# Patient Record
Sex: Female | Born: 1960 | Race: Black or African American | Hispanic: No | Marital: Single | Smoking: Current every day smoker
Health system: Southern US, Community
[De-identification: ages and names within clinical notes are randomized; demographics above are authoritative.]

## PROBLEM LIST (undated history)

## (undated) DIAGNOSIS — E7800 Pure hypercholesterolemia, unspecified: Secondary | ICD-10-CM

## (undated) DIAGNOSIS — I10 Essential (primary) hypertension: Secondary | ICD-10-CM

## (undated) HISTORY — DX: Pure hypercholesterolemia, unspecified: E78.00

---

## 2001-09-16 ENCOUNTER — Emergency Department (HOSPITAL_COMMUNITY): Admission: EM | Admit: 2001-09-16 | Discharge: 2001-09-16 | Payer: Self-pay | Admitting: Emergency Medicine

## 2001-09-16 ENCOUNTER — Encounter: Payer: Self-pay | Admitting: Emergency Medicine

## 2002-08-08 ENCOUNTER — Emergency Department (HOSPITAL_COMMUNITY): Admission: EM | Admit: 2002-08-08 | Discharge: 2002-08-08 | Payer: Self-pay | Admitting: Emergency Medicine

## 2003-08-04 ENCOUNTER — Emergency Department (HOSPITAL_COMMUNITY): Admission: EM | Admit: 2003-08-04 | Discharge: 2003-08-04 | Payer: Self-pay | Admitting: Emergency Medicine

## 2005-08-01 ENCOUNTER — Emergency Department (HOSPITAL_COMMUNITY): Admission: EM | Admit: 2005-08-01 | Discharge: 2005-08-01 | Payer: Self-pay | Admitting: *Deleted

## 2005-08-04 ENCOUNTER — Ambulatory Visit: Payer: Self-pay | Admitting: Family Medicine

## 2005-08-06 ENCOUNTER — Ambulatory Visit: Payer: Self-pay | Admitting: *Deleted

## 2009-09-01 ENCOUNTER — Inpatient Hospital Stay (HOSPITAL_COMMUNITY): Admission: EM | Admit: 2009-09-01 | Discharge: 2009-09-03 | Payer: Self-pay | Admitting: Emergency Medicine

## 2009-09-23 ENCOUNTER — Ambulatory Visit: Payer: Self-pay | Admitting: Family Medicine

## 2009-09-24 ENCOUNTER — Encounter (INDEPENDENT_AMBULATORY_CARE_PROVIDER_SITE_OTHER): Payer: Self-pay | Admitting: Internal Medicine

## 2009-09-24 LAB — CONVERTED CEMR LAB
Albumin: 4.3 g/dL (ref 3.5–5.2)
CO2: 18 meq/L — ABNORMAL LOW (ref 19–32)
Calcium: 8.4 mg/dL (ref 8.4–10.5)
Ferritin: 62 ng/mL (ref 10–291)
Glucose, Bld: 88 mg/dL (ref 70–99)
Iron: 53 ug/dL (ref 42–145)
Potassium: 3.7 meq/L (ref 3.5–5.3)
Sodium: 142 meq/L (ref 135–145)
TIBC: 390 ug/dL (ref 250–470)
TSH: 0.771 microintl units/mL (ref 0.350–4.500)
Total Protein: 7.4 g/dL (ref 6.0–8.3)
UIBC: 337 ug/dL

## 2009-10-02 ENCOUNTER — Ambulatory Visit: Payer: Self-pay | Admitting: Internal Medicine

## 2010-01-14 ENCOUNTER — Ambulatory Visit: Payer: Self-pay | Admitting: Internal Medicine

## 2010-01-14 LAB — CONVERTED CEMR LAB
ALT: <8 units/L (ref 0–35)
AST: 21 units/L (ref 0–37)
Albumin: 4.3 g/dL (ref 3.5–5.2)
Alkaline Phosphatase: 81 units/L (ref 39–117)
BUN: 13 mg/dL (ref 6–23)
CO2: 23 meq/L (ref 19–32)
Calcium: 9.4 mg/dL (ref 8.4–10.5)
Chloride: 106 meq/L (ref 96–112)
Creatinine, Ser: 0.63 mg/dL (ref 0.40–1.20)
Folate: 5.3 ng/mL
Glucose, Bld: 85 mg/dL (ref 70–99)
Potassium: 4 meq/L (ref 3.5–5.3)
Sodium: 141 meq/L (ref 135–145)
TSH: 0.806 microintl units/mL (ref 0.350–4.500)
Total Bilirubin: 0.3 mg/dL (ref 0.3–1.2)
Total Protein: 7.7 g/dL (ref 6.0–8.3)
Vitamin B-12: 390 pg/mL (ref 211–911)

## 2010-01-22 ENCOUNTER — Ambulatory Visit (HOSPITAL_COMMUNITY): Admission: RE | Admit: 2010-01-22 | Discharge: 2010-01-22 | Payer: Self-pay | Admitting: Internal Medicine

## 2010-09-13 LAB — CBC
HCT: 29.7 % — ABNORMAL LOW (ref 36.0–46.0)
MCV: 100.3 fL — ABNORMAL HIGH (ref 78.0–100.0)
Platelets: 80 10*3/uL — ABNORMAL LOW (ref 150–400)
Platelets: 83 10*3/uL — ABNORMAL LOW (ref 150–400)
RBC: 3 MIL/uL — ABNORMAL LOW (ref 3.87–5.11)
RDW: 15 % (ref 11.5–15.5)
WBC: 6 10*3/uL (ref 4.0–10.5)

## 2010-09-13 LAB — POCT I-STAT, CHEM 8
Calcium, Ion: 0.97 mmol/L — ABNORMAL LOW (ref 1.12–1.32)
Chloride: 100 mEq/L (ref 96–112)
HCT: 32 % — ABNORMAL LOW (ref 36.0–46.0)
TCO2: 23 mmol/L (ref 0–100)

## 2010-09-13 LAB — CULTURE, RESPIRATORY W GRAM STAIN: Culture: NORMAL

## 2010-09-13 LAB — COMPREHENSIVE METABOLIC PANEL
ALT: 35 U/L (ref 0–35)
AST: 161 U/L — ABNORMAL HIGH (ref 0–37)
Albumin: 2.8 g/dL — ABNORMAL LOW (ref 3.5–5.2)
Chloride: 102 mEq/L (ref 96–112)
Creatinine, Ser: 0.62 mg/dL (ref 0.4–1.2)
GFR calc Af Amer: >60 mL/min (ref >60)
Sodium: 137 mEq/L (ref 135–145)
Total Bilirubin: 0.9 mg/dL (ref 0.3–1.2)

## 2010-09-13 LAB — POCT CARDIAC MARKERS
CKMB, poc: <1 ng/mL — ABNORMAL LOW (ref 1.0–8.0)
Myoglobin, poc: 82.1 ng/mL (ref 12–200)
Troponin i, poc: <0.05 ng/mL (ref 0.00–0.09)

## 2010-09-13 LAB — D-DIMER, QUANTITATIVE: D-Dimer, Quant: 2.44 ug/mL-FEU — ABNORMAL HIGH (ref 0.00–0.48)

## 2010-09-13 LAB — DIFFERENTIAL
Basophils Absolute: 0 10*3/uL (ref 0.0–0.1)
Eosinophils Relative: 0 % (ref 0–5)
Lymphocytes Relative: 14 % (ref 12–46)

## 2010-09-13 LAB — ETHANOL: Alcohol, Ethyl (B): 210 mg/dL — ABNORMAL HIGH (ref 0–10)

## 2010-09-13 LAB — EXPECTORATED SPUTUM ASSESSMENT W GRAM STAIN, RFLX TO RESP C

## 2010-12-24 ENCOUNTER — Emergency Department (HOSPITAL_COMMUNITY)
Admission: EM | Admit: 2010-12-24 | Discharge: 2010-12-24 | Disposition: A | Discharge status: Home / Self Care | Payer: Self-pay | Attending: Emergency Medicine | Admitting: Emergency Medicine

## 2010-12-24 ENCOUNTER — Emergency Department (HOSPITAL_COMMUNITY): Payer: Self-pay

## 2010-12-24 DIAGNOSIS — W19XXXA Unspecified fall, initial encounter: Secondary | ICD-10-CM | POA: Insufficient documentation

## 2010-12-24 DIAGNOSIS — S61509A Unspecified open wound of unspecified wrist, initial encounter: Secondary | ICD-10-CM | POA: Insufficient documentation

## 2014-06-27 ENCOUNTER — Emergency Department (HOSPITAL_COMMUNITY): Payer: Self-pay

## 2014-06-27 ENCOUNTER — Encounter (HOSPITAL_COMMUNITY): Payer: Self-pay | Admitting: Cardiology

## 2014-06-27 ENCOUNTER — Emergency Department (HOSPITAL_COMMUNITY)
Admission: EM | Admit: 2014-06-27 | Discharge: 2014-06-27 | Disposition: A | Discharge status: Home / Self Care | Payer: Self-pay | Attending: Emergency Medicine | Admitting: Emergency Medicine

## 2014-06-27 DIAGNOSIS — R202 Paresthesia of skin: Secondary | ICD-10-CM | POA: Insufficient documentation

## 2014-06-27 DIAGNOSIS — R531 Weakness: Secondary | ICD-10-CM | POA: Insufficient documentation

## 2014-06-27 DIAGNOSIS — Z720 Tobacco use: Secondary | ICD-10-CM | POA: Insufficient documentation

## 2014-06-27 DIAGNOSIS — R200 Anesthesia of skin: Secondary | ICD-10-CM | POA: Insufficient documentation

## 2014-06-27 HISTORY — DX: Essential (primary) hypertension: I10

## 2014-06-27 LAB — CBC
HEMATOCRIT: 37.9 % (ref 36.0–46.0)
HEMOGLOBIN: 13.2 g/dL (ref 12.0–15.0)
MCH: 32.2 pg (ref 26.0–34.0)
MCHC: 34.8 g/dL (ref 30.0–36.0)
MCV: 92.4 fL (ref 78.0–100.0)
Platelets: 175 10*3/uL (ref 150–400)
RBC: 4.1 MIL/uL (ref 3.87–5.11)
RDW: 13.4 % (ref 11.5–15.5)
WBC: 5.6 10*3/uL (ref 4.0–10.5)

## 2014-06-27 LAB — BASIC METABOLIC PANEL
ANION GAP: 7 (ref 5–15)
BUN: 9 mg/dL (ref 6–23)
CHLORIDE: 107 meq/L (ref 96–112)
CO2: 26 mmol/L (ref 19–32)
CREATININE: 0.79 mg/dL (ref 0.50–1.10)
Calcium: 9.1 mg/dL (ref 8.4–10.5)
GFR calc Af Amer: >90 mL/min (ref >90)
GFR calc non Af Amer: >90 mL/min (ref >90)
Glucose, Bld: 101 mg/dL — ABNORMAL HIGH (ref 70–99)
POTASSIUM: 4.1 mmol/L (ref 3.5–5.1)
Sodium: 140 mmol/L (ref 135–145)

## 2014-06-27 LAB — I-STAT TROPONIN, ED: TROPONIN I, POC: 0 ng/mL (ref 0.00–0.08)

## 2014-06-27 MED ORDER — HYDROCHLOROTHIAZIDE 25 MG PO TABS: 25.0000 mg | ORAL_TABLET | Freq: Every day | ORAL | Status: DC

## 2014-06-27 NOTE — ED Notes (Signed)
Patient transported to MRI 

## 2014-06-27 NOTE — ED Provider Notes (Signed)
CSN: [id number]     Arrival date & time 06/27/14  4098 History   First MD Initiated Contact with Patient 06/27/14 1036     Chief Complaint  Patient presents with  . Numbness     (Consider location/radiation/quality/duration/timing/severity/associated sxs/prior Treatment) HPI Complains of intermittent left arm numbness and weakness for the past 2 weeks and intermittent right leg numbness and weakness for the past  1week. Numbness and weakness do not occur at the same time. Nothing makes symptoms better or worse. No treatment prior to coming here. No other associated symptoms no difficulty speaking or swallowing. No headache no chest pain. Presently asymptomatic without treatment History reviewed. No pertinent past medical history. past medical history hypertension History reviewed. No pertinent past surgical history. History reviewed. No pertinent family history. History  Substance Use Topics  . Smoking status: Current Every Day Smoker  . Smokeless tobacco: Not on file  . Alcohol Use: Yes   no illicit drug use OB History    No data available     Review of Systems  HENT: Negative.   Respiratory: Negative.   Cardiovascular: Negative.   Gastrointestinal: Negative.   Musculoskeletal: Negative.   Skin: Negative.   Neurological: Positive for weakness and numbness.  Psychiatric/Behavioral: Negative.   All other systems reviewed and are negative.     Allergies  Review of patient's allergies indicates no known allergies.  Home Medications   Prior to Admission medications   Not on File   BP 137/93 mmHg  Pulse 79  Temp(Src) 97.9 F (36.6 C) (Oral)  Wt 140 lb (63.504 kg)  SpO2 100% Physical Exam  Constitutional: She is oriented to person, place, and time. She appears well-developed and well-nourished.  HENT:  Head: Normocephalic and atraumatic.  Eyes: Conjunctivae are normal. Pupils are equal, round, and reactive to light.  Neck: Neck supple. No tracheal deviation present.  No thyromegaly present.  Cardiovascular: Normal rate and regular rhythm.   No murmur heard. Pulmonary/Chest: Effort normal and breath sounds normal.  Abdominal: Soft. Bowel sounds are normal. She exhibits no distension. There is no tenderness.  Musculoskeletal: Normal range of motion. She exhibits no edema or tenderness.  Neurological: She is alert and oriented to person, place, and time. She has normal reflexes. She displays normal reflexes. No cranial nerve deficit. She exhibits normal muscle tone. Coordination normal.  Gait normal Romberg normal pronator drift normal motor strength 5 over 5 overall grip strengths bilateral equal and 5 over 5 finger to nose normal DTR symmetric bilaterally knee jerk ankle jerk and biceps toes or going bilaterally  Skin: Skin is warm and dry. No rash noted.  Psychiatric: She has a normal mood and affect. Judgment normal.  Nursing note and vitals reviewed.   ED Course  Procedures (including critical care time) Labs Review Labs Reviewed  CBC  BASIC METABOLIC PANEL  I-STAT TROPOININ, ED    Imaging Review Ct Head (brain) Wo Contrast  06/27/2014   CLINICAL DATA:  Left arm weakness for several weeks, worsening last night  EXAM: CT HEAD WITHOUT CONTRAST  TECHNIQUE: Contiguous axial images were obtained from the base of the skull through the vertex without intravenous contrast.  COMPARISON:  None.  FINDINGS: No skull fracture is noted. Paranasal sinuses and mastoid air cells are unremarkable. No intracranial hemorrhage, mass effect or midline shift. No acute cortical infarction. No intra or extra-axial fluid collection. No mass lesion is noted on this unenhanced scan. The gray and white-matter differentiation is preserved.  IMPRESSION: No acute  intracranial abnormality.   Electronically Signed   By: Natasha Mead M.D.   On: 06/27/2014 10:32     EKG Interpretation   Date/Time:  Thursday June 27 2014 09:57:32 EST Ventricular Rate:  76 PR Interval:  160 QRS  Duration: 74 QT Interval:  382 QTC Calculation: 429 R Axis:   81 Text Interpretation:  Sinus rhythm with marked sinus arrhythmia Otherwise  normal ECG No significant change since last tracing Confirmed by  Ethelda Chick  MD, Baylon Santelli [phone]) on 06/27/2014 2:48:09 PM     2:45 PM patient asymptomatic  Results for orders placed or performed during the hospital encounter of 06/27/14  CBC  Result Value Ref Range   WBC 5.6 4.0 - 10.5 K/uL   RBC 4.10 3.87 - 5.11 MIL/uL   Hemoglobin 13.2 12.0 - 15.0 g/dL   HCT 74.2 59.5 - 63.8 %   MCV 92.4 78.0 - 100.0 fL   MCH 32.2 26.0 - 34.0 pg   MCHC 34.8 30.0 - 36.0 g/dL   RDW 75.6 43.3 - 29.5 %   Platelets 175 150 - 400 K/uL  Basic metabolic panel  Result Value Ref Range   Sodium 140 135 - 145 mmol/L   Potassium 4.1 3.5 - 5.1 mmol/L   Chloride 107 96 - 112 mEq/L   CO2 26 19 - 32 mmol/L   Glucose, Bld 101 (H) 70 - 99 mg/dL   BUN 9 6 - 23 mg/dL   Creatinine, Ser 1.88 0.50 - 1.10 mg/dL   Calcium 9.1 8.4 - 41.6 mg/dL   GFR calc non Af Amer >90 >90 mL/min   GFR calc Af Amer >90 >90 mL/min   Anion gap 7 5 - 15  I-stat troponin, ED (not at [facility])  Result Value Ref Range   Troponin i, poc 0.00 0.00 - 0.08 ng/mL   Comment 3           Ct Head (brain) Wo Contrast  06/27/2014   CLINICAL DATA:  Left arm weakness for several weeks, worsening last night  EXAM: CT HEAD WITHOUT CONTRAST  TECHNIQUE: Contiguous axial images were obtained from the base of the skull through the vertex without intravenous contrast.  COMPARISON:  None.  FINDINGS: No skull fracture is noted. Paranasal sinuses and mastoid air cells are unremarkable. No intracranial hemorrhage, mass effect or midline shift. No acute cortical infarction. No intra or extra-axial fluid collection. No mass lesion is noted on this unenhanced scan. The gray and white-matter differentiation is preserved.  IMPRESSION: No acute intracranial abnormality.   Electronically Signed   By: Natasha Mead M.D.   On: 06/27/2014 10:32    Mr Brain Wo Contrast  06/27/2014   CLINICAL DATA:  Left-sided weakness 2 weeks  EXAM: MRI HEAD WITHOUT CONTRAST  TECHNIQUE: Multiplanar, multiecho pulse sequences of the brain and surrounding structures were obtained without intravenous contrast.  COMPARISON:  CT head 06/27/2014  FINDINGS: Negative for acute infarct.  Scattered small hyperintensities in the subcortical white matter bilaterally most likely related to chronic microvascular ischemia. Brainstem and cerebellum intact. No cortical infarct.  Ventricle size is normal.  Pituitary normal in size  Negative for hemorrhage or fluid collection  Negative for mass or edema  Mucosal edema in the left maxillary sinus is mild.  IMPRESSION: No acute abnormality.  Mild chronic microvascular ischemia.   Electronically Signed   By: Marlan Palau M.D.   On: 06/27/2014 13:45    Counseled patient for 5 minutes on smoking cessation MDM  She's been  furnished with several locations for primary care physician [facility] and [place] [place] [facility] Final diagnoses:  None   Rx HCTZ Diagnosis #1 paresthesias #2tobacco abuse  #3hypertension     Doug SouSam Brittain Hosie, MD 06/27/14 1453

## 2014-06-27 NOTE — Discharge Instructions (Signed)
Call any of the numbers that you were furnished tomorrow in order to get a primary care physician. Ask your new primary care physician to help you stop smoking. Call [facility] or [place] [facility] to schedule the next available appointment regarding your numbness. Take 1 baby aspirin (81 milligrams) daily. Take the medication as prescribed for blood pressure. Get your blood pressure rechecked within the next 3 weeks

## 2014-06-27 NOTE — ED Notes (Signed)
Pt reports that she has had left sided weakness for the past 2 weeks. States that it seems to have gotten worse over the past couple of days. Grip strength slight decreased on the left side. Pt A&Ox4

## 2014-11-08 ENCOUNTER — Ambulatory Visit: Payer: Self-pay | Admitting: Neurology

## 2014-12-18 ENCOUNTER — Ambulatory Visit (INDEPENDENT_AMBULATORY_CARE_PROVIDER_SITE_OTHER): Payer: No Typology Code available for payment source | Admitting: Neurology

## 2014-12-18 ENCOUNTER — Encounter: Payer: Self-pay | Admitting: Neurology

## 2014-12-18 VITALS — BP 110/78 | HR 80 | Ht 71.0 in | Wt 154.2 lb

## 2014-12-18 DIAGNOSIS — M6289 Other specified disorders of muscle: Secondary | ICD-10-CM | POA: Diagnosis not present

## 2014-12-18 DIAGNOSIS — R011 Cardiac murmur, unspecified: Secondary | ICD-10-CM

## 2014-12-18 DIAGNOSIS — R202 Paresthesia of skin: Secondary | ICD-10-CM

## 2014-12-18 DIAGNOSIS — R29898 Other symptoms and signs involving the musculoskeletal system: Secondary | ICD-10-CM

## 2014-12-18 NOTE — Progress Notes (Signed)
Note sent

## 2014-12-18 NOTE — Progress Notes (Signed)
[facility] Clinic Note - Initial Visit   Date: 12/18/2014   Jaime Adams MRN: [id number] DOB: 1960-09-01   Dear Jaime Passe, NP:  Thank you for your kind referral of Jaime Adams for consultation of bilateral hand weakness. Although her history is well known to you, please allow [place] to reiterate it for the purpose of our medical record. The patient was accompanied to the clinic by self.    History of Present Illness: Jaime Adams is a 54 y.o. right-handed African American female with hyperlipidemia presenting for evaluation of bilateral hand weakness.    She works as a Sales executive at [employer]. Starting around February 2016, she starting experiencing difficulty with grasping the spoon to plate the food.  It was only lasting a few seconds to minutes initially.  It started to involve both hands, where she was unable to hold the plate.  During these spells, she cannot move her hands and they are numb.  She is able to move her forearm with her elbow, but has no movement of her wrist or fingers.  Last week, it lasted about 5 minutes before she regained strength of her hands.  She has numbness/tingling especially at night.  No neck pain or cramping of the hands.  These spells predominately occur at work and she has noticed that anxiety can trigger it at times.  She tries to breathe and relax through it.  It occurs about 2-3 times per month.  Sometimes, she can tell when it comes on because she gets anxious and excited.  She does not loose consciousness.     She reports having a slight stroke manifesting with right facial droop when she was in elementary school.    Out-side paper records, electronic medical record, and images have been reviewed where available and summarized as:  Labs 08/26/2014:  TSH 1.178, BMP nml  Past Medical History  Diagnosis Date  . Hypertension   . High cholesterol     History reviewed. No pertinent  past surgical history.   Medications:  No current outpatient prescriptions on file prior to visit.   No current facility-administered medications on file prior to visit.    Allergies: No Known Allergies  Family History: Family History  Problem Relation Age of Onset  . Hypertension Mother   . Cancer Maternal Aunt   . Alzheimer's disease Mother   . Heart attack Brother   . Cirrhosis Father   . Stroke Brother   . Aneurysm Brother   . Diabetes Brother     Social History: History   Social History  . Marital Status: Married    Spouse Name: N/A  . Number of Children: N/A  . Years of Education: N/A   Occupational History  . Not on file.   Social History Main Topics  . Smoking status: Current Every Day Smoker  . Smokeless tobacco: Never Used  . Alcohol Use: 0.0 oz/week    0 Standard drinks or equivalent per week  . Drug Use: No  . Sexual Activity: Not on file   Other Topics Concern  . Not on file   Social History Narrative   Lives alone.  Rents a room.  Has 2 children.  Works at [employer].  Education: high school.    Review of Systems:  CONSTITUTIONAL: No fevers, chills, night sweats, or weight loss.   EYES: No visual changes or eye pain ENT: No hearing changes.  No history of nose bleeds.  RESPIRATORY: No cough, wheezing and shortness of breath.   CARDIOVASCULAR: Negative for chest pain, and palpitations.   GI: Negative for abdominal discomfort, blood in stools or black stools.  No recent change in bowel habits.   GU:  No history of incontinence.   MUSCLOSKELETAL: No history of joint pain or swelling.  No myalgias.   SKIN: Negative for lesions, rash, and itching.   HEMATOLOGY/ONCOLOGY: Negative for prolonged bleeding, bruising easily, and swollen nodes.  No history of cancer.   ENDOCRINE: Negative for cold or heat intolerance, polydipsia or goiter.   PSYCH:  No depression + anxiety symptoms.   NEURO: As Above.   Vital Signs:  BP 110/78 mmHg  Pulse 80   Ht 5\' 11"  (1.803 m)  Wt 154 lb 3 oz (69.939 kg)  BMI 21.51 kg/m2  SpO2 92% Pain Scale: 0 on a scale of 0-10   General Medical Exam:   General:  Well appearing, comfortable.   Eyes/ENT: see cranial nerve examination.   Neck: No masses appreciated.  Full range of motion without tenderness.  No carotid bruits. Respiratory:  Clear to auscultation, good air entry bilaterally.   Cardiac:  Systolic murmur, regular rate Extremities:  No deformities, edema, or skin discoloration.  Skin:  No rashes or lesions.  Neurological Exam: MENTAL STATUS including orientation to time, place, person, recent and remote memory, attention span and concentration, language, and fund of knowledge is normal.  Speech is not dysarthric.  CRANIAL NERVES: II:  No visual field defects.  Unremarkable fundi.   III-IV-VI: Pupils equal round and reactive to light.  Normal conjugate, extra-ocular eye movements in all directions of gaze.  No nystagmus.  No ptosis.   V:  Normal facial sensation.     VII:  Normal facial symmetry and movements.  No pathologic facial reflexes.  VIII:  Normal hearing and vestibular function.   IX-X:  Normal palatal movement.   XI:  Normal shoulder shrug and head rotation.   XII:  Normal tongue strength and range of motion, no deviation or fasciculation.  MOTOR:  No atrophy, fasciculations or abnormal movements.  No pronator drift.  Tone is normal.    Right Upper Extremity:    Left Upper Extremity:    Deltoid  5/5   Deltoid  5/5   Biceps  5/5   Biceps  5/5   Triceps  5/5   Triceps  5/5   Wrist extensors  5/5   Wrist extensors  5/5   Wrist flexors  5/5   Wrist flexors  5/5   Finger extensors  5/5   Finger extensors  5/5   Finger flexors  5/5   Finger flexors  5/5   Dorsal interossei  5/5   Dorsal interossei  5/5   Abductor pollicis  5/5   Abductor pollicis  5/5   Tone (Ashworth scale)  0  Tone (Ashworth scale)  0   Right Lower Extremity:    Left Lower Extremity:    Hip flexors  5/5    Hip flexors  5/5   Hip extensors  5/5   Hip extensors  5/5   Knee flexors  5/5   Knee flexors  5/5   Knee extensors  5/5   Knee extensors  5/5   Dorsiflexors  5/5   Dorsiflexors  5/5   Plantarflexors  5/5   Plantarflexors  5/5   Toe extensors  5/5   Toe extensors  5/5   Toe flexors  5/5   Toe flexors  5/5   Tone (Ashworth scale)  0  Tone (Ashworth scale)  0   MSRs:  Right                                                                 Left brachioradialis 2+  brachioradialis 2+  biceps 2+  biceps 2+  triceps 2+  triceps 2+  patellar 2+  patellar 2+  ankle jerk 1+  ankle jerk 1+  Hoffman no  Hoffman no  plantar response down  plantar response down   SENSORY:  Normal and symmetric perception of light touch, pinprick, vibration, and proprioception.  Romberg's sign absent.   COORDINATION/GAIT: Normal finger-to- nose-finger and heel-to-shin.  Intact rapid alternating movements bilaterally.  Able to rise from a chair without using arms.  Gait narrow based and stable. Tandem and stressed gait intact.    IMPRESSION/PLAN: Ms. Urbieta is a 54 year-old female presenting for evaluation of episodic bilateral hand weakness and numbness.  Her exam is entirely normal and non-focal making it difficult to localize her symptoms.  EMG of the upper extremities will be ordered to better characterize the nature of her symptoms - symptoms are atypical for both carpal tunnel syndrome and cervical radiculopathy.    Regarding her systolic heart murmur, I have asked her to follow-up with her PCP   Return to clinic in 6-8 weeks   The duration of this appointment visit was 40 minutes of face-to-face time with the patient.  Greater than 50% of this time was spent in counseling, explanation of diagnosis, planning of further management, and coordination of care.   Thank you for allowing me to participate in patient's care.  If I can answer any additional questions, I would be pleased to do so.     Sincerely,    Kilo Eshelman K. Allena Katz, DO

## 2014-12-18 NOTE — Patient Instructions (Addendum)
1.  EMG of the upper extremities 2.  Follow-up with your primary care doctor regarding any additional work for your heart murmur 3.  Return to clinic in 6 weeks

## 2015-01-07 ENCOUNTER — Encounter: Payer: No Typology Code available for payment source | Admitting: Neurology

## 2015-01-17 ENCOUNTER — Other Ambulatory Visit: Payer: Self-pay | Admitting: *Deleted

## 2015-01-17 DIAGNOSIS — R202 Paresthesia of skin: Secondary | ICD-10-CM

## 2015-01-17 DIAGNOSIS — R29898 Other symptoms and signs involving the musculoskeletal system: Secondary | ICD-10-CM

## 2015-01-21 ENCOUNTER — Encounter: Payer: No Typology Code available for payment source | Admitting: Neurology

## 2015-02-13 ENCOUNTER — Ambulatory Visit: Payer: No Typology Code available for payment source | Admitting: Neurology

## 2015-03-07 ENCOUNTER — Ambulatory Visit: Payer: No Typology Code available for payment source | Admitting: Neurology

## 2015-06-09 ENCOUNTER — Encounter (HOSPITAL_COMMUNITY): Payer: Self-pay | Admitting: Cardiology

## 2015-06-09 ENCOUNTER — Emergency Department (HOSPITAL_COMMUNITY)
Admission: EM | Admit: 2015-06-09 | Discharge: 2015-06-09 | Disposition: A | Discharge status: Home / Self Care | Payer: No Typology Code available for payment source | Attending: Emergency Medicine | Admitting: Emergency Medicine

## 2015-06-09 DIAGNOSIS — E7800 Pure hypercholesterolemia, unspecified: Secondary | ICD-10-CM | POA: Diagnosis not present

## 2015-06-09 DIAGNOSIS — G8929 Other chronic pain: Secondary | ICD-10-CM | POA: Diagnosis not present

## 2015-06-09 DIAGNOSIS — K029 Dental caries, unspecified: Secondary | ICD-10-CM | POA: Diagnosis not present

## 2015-06-09 DIAGNOSIS — K0889 Other specified disorders of teeth and supporting structures: Secondary | ICD-10-CM | POA: Insufficient documentation

## 2015-06-09 DIAGNOSIS — Z79899 Other long term (current) drug therapy: Secondary | ICD-10-CM | POA: Insufficient documentation

## 2015-06-09 DIAGNOSIS — I10 Essential (primary) hypertension: Secondary | ICD-10-CM | POA: Diagnosis not present

## 2015-06-09 DIAGNOSIS — F17200 Nicotine dependence, unspecified, uncomplicated: Secondary | ICD-10-CM | POA: Insufficient documentation

## 2015-06-09 DIAGNOSIS — Z7982 Long term (current) use of aspirin: Secondary | ICD-10-CM | POA: Insufficient documentation

## 2015-06-09 MED ORDER — IBUPROFEN 400 MG PO TABS
800.0000 mg | ORAL_TABLET | Freq: Once | ORAL | Status: AC
  Administered 2015-06-09: 800 mg via ORAL
  Filled 2015-06-09: qty 2

## 2015-06-09 MED ORDER — ACETAMINOPHEN-CODEINE #3 300-30 MG PO TABS: 1.0000 | ORAL_TABLET | Freq: Four times a day (QID) | ORAL | Status: DC | PRN

## 2015-06-09 MED ORDER — IBUPROFEN 800 MG PO TABS: 800.0000 mg | ORAL_TABLET | Freq: Three times a day (TID) | ORAL | Status: DC | PRN

## 2015-06-09 NOTE — ED Notes (Signed)
Pt reports right upper tooth pain after biting down on something about 2 days ago. Reports trying OTC medication without relief.

## 2015-06-09 NOTE — Discharge Instructions (Signed)
Read the information below.  Use the prescribed medication as directed.  Please discuss all new medications with your pharmacist.  You may return to the Emergency Department at any time for worsening condition or any new symptoms that concern you.  Please call the dentist listed above within 48 hours to schedule a close follow up appointment.  If you develop fevers, swelling in your face, difficulty swallowing or breathing, return to the ER immediately for a recheck.   ° ° °Dental Pain °Dental pain may be caused by many things, including: °· Tooth decay (cavities or caries). Cavities expose the nerve of your tooth to air and hot or cold temperatures. This can cause pain or discomfort. °· Abscess or infection. A dental abscess is a collection of infected pus from a bacterial infection in the inner part of the tooth (pulp). It usually occurs at the end of the tooth's root. °· Injury. °· An unknown reason (idiopathic). °Your pain may be mild or severe. It may only occur when: °· You are chewing. °· You are exposed to hot or cold temperature. °· You are eating or drinking sugary foods or beverages, such as soda or candy. °Your pain may also be constant. °HOME CARE INSTRUCTIONS °Watch your dental pain for any changes. The following actions may help to lessen any discomfort that you are feeling: °· Take medicines only as directed by your dentist. °· If you were prescribed an antibiotic medicine, finish all of it even if you start to feel better. °· Keep all follow-up visits as directed by your dentist. This is important. °· Do not apply heat to the outside of your face. °· Rinse your mouth or gargle with salt water if directed by your dentist. This helps with pain and swelling. °· You can make salt water by adding ¼ tsp of salt to 1 cup of warm water. °· Apply ice to the painful area of your face: °· Put ice in a plastic bag. °· Place a towel between your skin and the bag. °· Leave the ice on for 20 minutes, 2-3 times per  day. °· Avoid foods or drinks that cause you pain, such as: °· Very hot or very cold foods or drinks. °· Sweet or sugary foods or drinks. °SEEK MEDICAL CARE IF: °· Your pain is not controlled with medicines. °· Your symptoms are worse. °· You have new symptoms. °SEEK IMMEDIATE MEDICAL CARE IF: °· You are unable to open your mouth. °· You are having trouble breathing or swallowing. °· You have a fever. °· Your face, neck, or jaw is swollen. °  °This information is not intended to replace advice given to you by your health care provider. Make sure you discuss any questions you have with your health care provider. °  °Document Released: 06/07/2005 Document Revised: 10/22/2014 Document Reviewed: 06/03/2014 °Elsevier Interactive Patient Education ©2016 Elsevier Inc. ° °Dental Caries °Dental caries (also called tooth decay) is the most common oral disease. It can occur at any age but is more common in children and young adults.  °HOW DENTAL CARIES DEVELOPS  °The process of decay begins when bacteria and foods (particularly sugars and starches) combine in your mouth to produce plaque. Plaque is a substance that sticks to the hard, outer surface of a tooth (enamel). The bacteria in plaque produce acids that attack enamel. These acids may also attack the root surface of a tooth (cementum) if it is exposed. Repeated attacks dissolve these surfaces and create holes in the tooth (  cavities). If left untreated, the acids destroy the other layers of the tooth.  °RISK FACTORS °· Frequent sipping of sugary beverages.   °· Frequent snacking on sugary and starchy foods, especially those that easily get stuck in the teeth.   °· Poor oral hygiene.   °· Dry mouth.   °· Substance abuse such as methamphetamine abuse.   °· Broken or poor-fitting dental restorations.   °· Eating disorders.   °· Gastroesophageal reflux disease (GERD).   °· Certain radiation treatments to the head and neck. °SYMPTOMS °In the early stages of dental caries,  symptoms are seldom present. Sometimes white, chalky areas may be seen on the enamel or other tooth layers. In later stages, symptoms may include: °· Pits and holes on the enamel. °· Toothache after sweet, hot, or cold foods or drinks are consumed. °· Pain around the tooth. °· Swelling around the tooth. °DIAGNOSIS  °Most of the time, dental caries is detected during a regular dental checkup. A diagnosis is made after a thorough medical and dental history is taken and the surfaces of your teeth are checked for signs of dental caries. Sometimes special instruments, such as lasers, are used to check for dental caries. Dental X-ray exams may be taken so that areas not visible to the eye (such as between the contact areas of the teeth) can be checked for cavities.  °TREATMENT  °If dental caries is in its early stages, it may be reversed with a fluoride treatment or an application of a remineralizing agent at the dental office. Thorough brushing and flossing at home is needed to aid these treatments. If it is in its later stages, treatment depends on the location and extent of tooth destruction:  °· If a small area of the tooth has been destroyed, the destroyed area will be removed and cavities will be filled with a material such as gold, silver amalgam, or composite resin.   °· If a large area of the tooth has been destroyed, the destroyed area will be removed and a cap (crown) will be fitted over the remaining tooth structure.   °· If the center part of the tooth (pulp) is affected, a procedure called a root canal will be needed before a filling or crown can be placed.   °· If most of the tooth has been destroyed, the tooth may need to be pulled (extracted). °HOME CARE INSTRUCTIONS °You can prevent, stop, or reverse dental caries at home by practicing good oral hygiene. Good oral hygiene includes: °· Thoroughly cleaning your teeth at least twice a day with a toothbrush and dental floss.   °· Using a fluoride toothpaste.  A fluoride mouth rinse may also be used if recommended by your dentist or health care provider.   °· Restricting the amount of sugary and starchy foods and sugary liquids you consume.   °· Avoiding frequent snacking on these foods and sipping of these liquids.   °· Keeping regular visits with a dentist for checkups and cleanings. °PREVENTION  °· Practice good oral hygiene. °· Consider a dental sealant. A dental sealant is a coating material that is applied by your dentist to the pits and grooves of teeth. The sealant prevents food from being trapped in them. It may protect the teeth for several years. °· Ask about fluoride supplements if you live in a community without fluorinated water or with water that has a low fluoride content. Use fluoride supplements as directed by your dentist or health care provider. °· Allow fluoride varnish applications to teeth if directed by your dentist or health care provider. °  °  This information is not intended to replace advice given to you by your health care provider. Make sure you discuss any questions you have with your health care provider. °  °Document Released: 02/27/2002 Document Revised: 06/28/2014 Document Reviewed: 06/09/2012 °Elsevier Interactive Patient Education ©2016 Elsevier Inc. ° ° ° °Emergency Department Resource Guide °1) Find a Doctor and Pay Out of Pocket °Although you won't have to find out who is covered by your insurance plan, it is a good idea to ask around and get recommendations. You will then need to call the office and see if the doctor you have chosen will accept you as a new patient and what types of options they offer for patients who are self-pay. Some doctors offer discounts or will set up payment plans for their patients who do not have insurance, but you will need to ask so you aren't surprised when you get to your appointment. ° °2) Contact Your Local Health Department °Not all health departments have doctors that can see patients for sick  visits, but many do, so it is worth a call to see if yours does. If you don't know where your local health department is, you can check in your phone book. The CDC also has a tool to help you locate your state's health department, and many state websites also have listings of all of their local health departments. ° °3) Find a Walk-in Clinic °If your illness is not likely to be very severe or complicated, you may want to try a walk in clinic. These are popping up all over the country in pharmacies, drugstores, and shopping centers. They're usually staffed by nurse practitioners or physician assistants that have been trained to treat common illnesses and complaints. They're usually fairly quick and inexpensive. However, if you have serious medical issues or chronic medical problems, these are probably not your best option. ° °No Primary Care Doctor: °- Call [facility] at  832-8000 - they can help you locate a primary care doctor that  accepts your insurance, provides certain services, etc. °- Physician Referral Service- 1-800-533-3463 ° °Chronic Pain Problems: °Organization         Address  Phone   Notes  °[facility]  (336) 297-2271 Patients need to be referred by their primary care doctor.  ° °Medication Assistance: °Organization         Address  Phone   Notes  °[place] [facility] [address] °[place], [place] [address] (336) 641-8030 --Must be a resident of [place] °-- Must have NO insurance coverage whatsoever (no Medicaid/ Medicare, etc.) °-- The pt. MUST have a primary care doctor that directs their care regularly and follows them in the community °  °[facility]  (866) 331-1348   °[employer]  (888) 892-1162   ° °Agencies that provide inexpensive medical care: °Organization         Address  Phone   Notes  °[facility]  (336) 832-8035   °[facility]    (336) 832-7272   °[facility] [address] °[place], [place] [address] (336) 832-4777   °[facility] [address], °[place] (336) 271-4999   °[facility]    (336) 373-0678   °[facility]    (336) 272-1050   °[facility] ° [address],  Phone:  (336) 832-4444, Fax:  (336) 832-4440 Hours of Operation:  9 am -   6 pm, M-F.  Also accepts Medicaid/Medicare and self-pay.  ° [facility] ° [address], [place] Phone: (336) [phone], Fax: (336) [phone]. Hours of Operation:  8:30 am - 5:30 pm, M-F.  Also accepts Medicaid and self-pay.  °[facility] [place] [address], [place] Phone: (336) 878-6027   °[facility] [address], [place], [place] (336)723-1848, Ext. 123 Mondays & Thursdays: 7-9 AM.  First 15 patients are seen on a first come, first serve basis. °  ° °Medicaid-accepting [place] Providers: ° °Organization         Address  Phone   Notes  °[facility] [address], [place] (336) 641-2100 Also accepts self-pay patients.  °[facility] [address], [place] ° (336) 856-9996   °[facility] [address], [place] (336) 288-8857   °[facility] [address] [place] [address], [place] (336) 299-7000   °Veita Bland [address], [place]  ° (336) [phone] Only accepts [place] Access Medicaid patients after they have their name applied to their card.  ° °Self-Pay (no insurance) in [place]: ° °Organization         Address  Phone   Notes  °Sickle Cell Patients, [facility] [address], [place] (336) 832-1970   °[facility] [address], [place] (336) 832-4400   °[facility] ° [address], [place] (336) 992-4800   °[facility]/Dr. Osei-Bonsu ° [address] [place] [address],  [place] or [address], [place] (336) [phone] Phone number for both [place] and [place] locations is the same.  °[facility] [address], [place] (336) 299-0000   °[facility] [address] [place] [address], [place] or [address] (336) 852-7530 °(336) 878-2260   °[facility] [address], [place] (336) 350-1642, phone; (336) 294-5005, fax Sees patients 1st and 3rd Saturday of every month.  Must not qualify for public or private insurance (i.e. Medicaid, Medicare, [place] Health Choice, Veterans' Benefits) • Household income should be no more than 200% of the poverty level •The clinic cannot treat you if you are pregnant or think you are pregnant • Sexually transmitted diseases are not treated at the clinic.  ° ° °Dental Care: °Organization         Address  Phone  Notes  °[facility] [address], [place] (336) 641-6152 Accepts children up to age 21 who are enrolled in Medicaid or [place] Health Choice; pregnant women with a Medicaid card; and children who have applied for Medicaid or [place] Health Choice, but were declined, whose parents can pay a reduced fee at time of service.  °[facility] [place]  [address], [place] (336) 641-7733 Accepts children up to age 21 who are enrolled in Medicaid or [place] Health Choice; pregnant women with a Medicaid card; and children who have applied for Medicaid or  Health Choice, but were declined, whose parents can pay a reduced fee at time of service.  °[facility] ° [address],  (336) 641-4533 Patients are seen by appointment only. Walk-ins are not accepted. [facility] will see patients 18 years of age and older. °  Monday - Tuesday (8am-5pm) °Most Wednesdays (8:30-5pm) °$30 per visit, cash only  °[facility] ° [address], [place] (336) 641-4533 Patients are seen by appointment only. Walk-ins are not accepted. [facility] will see patients 18 years of age and older. °One Wednesday Evening (Monthly: Volunteer Based).  $30 per visit, cash only  °[facility]  (919) 537-3737 for adults; Children under age 4, call Graduate Pediatric Dentistry at ([phone]) [phone]. Children aged 4-14, please call ([phone]) [phone] to request a pediatric application. ° Dental services are provided in all areas of dental care including fillings, crowns and bridges, complete and partial dentures, implants, gum treatment, root canals, and extractions. Preventive care is also provided. Treatment is provided to both adults and children. °Patients are selected via a lottery and there is often a waiting list. °  °[facility] [address], °[place] ° (336) 763-8833 [email or web address] °  °[facility] [address], [place], [place] (336)723-1848, Ext. 123 Second and Fourth Thursday of each month, opens at 6:30 AM; Clinic ends at 9 AM.  Patients are seen on a first-come first-served basis, and a limited number are seen during each clinic.  ° °[facility] ° [address], [place], [place] (336) 723-7904   Eligibility Requirements °You must have lived in [place], [place], or [place] counties for at least the last three months. °  You cannot be eligible for state or federal sponsored healthcare insurance, including Veterans Administration, Medicaid, or Medicare. °  You generally cannot be eligible for healthcare insurance through your employer.  °  How to apply: °Eligibility screenings are held every Tuesday and Wednesday afternoon from 1:00 pm until 4:00 pm. You do not need an appointment for the interview!  °[facility] [address], [place], [place] 336-631-2330   °[facility]  336-342-8273   °[facility]  336-703-3100   °[facility]  336-570-6415   ° °Behavioral Health Resources in the Community: °Intensive Outpatient Programs °Organization         Address  Phone  Notes  °[facility] [address], [place], [place] 336-878-6098   °[facility] [address], [place], [place] 336-832-9800   °[facility] [address], [place], [place] ° 336-882-2125   °[facility] [address],  °[place], [place] 1-800-853-5163 or 336-641-4981   °Substance Abuse Resources °Organization         Address  Phone  Notes  °[facility]  336-882-2125   °[facility]  336-784-9470   °[facility]  336-285-9073   °[facility]  336-845-3988   °Residential & Outpatient Substance Abuse Program  1-800-659-3381   °Psychological Services °Organization         Address  Phone  Notes  °[facility]  336- 832-9600   °[facility]  336- 378-7881   °[facility] [address], [place] 1-800-853-5163 or 336-641-4981   ° °Mobile Crisis Teams °Organization         Address  Phone  Notes  °[facility], Mobile Crisis Care Unit  1-877-626-1772   °Assertive °[facility] ° [address] [place], [place] 336-834-9664   °Sharon DeEsch [address] °[place] 336-554-5454   ° °Self-Help/Support Groups °Organization         Address  Phone               Notes  °[facility] - variety of support groups  336- 373-1402 Call for more information  °Narcotics Anonymous (NA), [facility] [address], °[place]  2 meetings at this location  ° °Residential Treatment Programs °Organization         Address  Phone  Notes  °[facility] [address],    °[place]  1-866-801-8205   °[facility] ° [address], [place], [place] 704-293-8524   °[facility] [address], [place]  336-845-3988 Admissions: 8am-3pm M-F  °[facility] [address]    °[place], [place] 336-841-1104   °[facility] [address], [place], [place] 336-379-7146   °[facility] [address]  °[place], [place] 336-285-9073   °[facility] - Intensive Outpatient [address], [place], [place] 336-852-3033   °[facility] [address]  °[place], [place] 1-877-615-2722 or 336-784-9470   °[facility] [address] [place], [place] 336-227-7417 Accepts Medicaid  °[facility] [address]  °[place] 1-800-659-3381 Substance Abuse/Addiction Treatment  ° °[place] Behavioral Health Resources °Organization         Address  Phone  Notes  °[facility]  (888) 581-9988   °Julie Brannon, PhD [address] [place], [place]   (336) 349-5553 or (336) 951-0000   °[facility]   [address] °[place], [place] (336) 349-4454   °[facility] [address], [place], [place] (336) 342-8316 Insurance/Medicaid/sponsorship through [facility]  °[facility] [address]                                    [place], [place] (336) 342-8316 Therapy/tele-psych/case  °[facility] [address]  ° [place],  (336) 349-2233    °Dr. Arfeen  (336) 349-4544   °[facility] [place]  [employer] [place] [facility] 1) [address], [place] °2) [address], [place] °3)  [address], [place] (336) 349-3220 °(336) 342-7768 ° °(336) 342-8140   °[place] [facility] (336) 342-1394 or (336) 342-3537 (After Hours)    ° ° °

## 2015-06-09 NOTE — ED Provider Notes (Signed)
CSN: [id number]     Arrival date & time 06/09/15  1508 History  By signing my name below, I, Placido SouLogan Joldersma, attest that this documentation has been prepared under the direction and in the presence of [facility] CenterEmily Ravenna Legore, PA-C. Electronically Signed: Placido SouLogan Joldersma, ED Scribe. 06/09/2015. 4:19 PM.   Chief Complaint  Patient presents with  . Dental Pain   The history is provided by the patient. No language interpreter was used.    HPI Comments: Jaime Adams is a 54 y.o. female who presents to the Emergency Department complaining of worsening, moderate, sudden onset, sharp, right upper dental pain with onset last night. She notes that she was eating and chipped the affected tooth which worsened a point of intermittent, chronic, dental pain. She notes worsening pain when eating. Pt notes taking 1x ibuprofen last night which provided short term relief. Pt denies having a dental provider. She denies fevers, chills, sore throat, difficulty swallowing, SOB and facial swelling.   Past Medical History  Diagnosis Date  . Hypertension   . High cholesterol    History reviewed. No pertinent past surgical history. Family History  Problem Relation Age of Onset  . Hypertension Mother   . Cancer Maternal Aunt   . Alzheimer's disease Mother   . Heart attack Brother   . Cirrhosis Father   . Stroke Brother   . Aneurysm Brother   . Diabetes Brother    Social History  Substance Use Topics  . Smoking status: Current Every Day Smoker  . Smokeless tobacco: Never Used  . Alcohol Use: 0.0 oz/week    0 Standard drinks or equivalent per week   OB History    No data available     Review of Systems  Constitutional: Negative for fever and chills.  HENT: Positive for dental problem. Negative for facial swelling, sore throat and trouble swallowing.   Respiratory: Negative for shortness of breath and stridor.   Musculoskeletal: Negative for myalgias.  Skin: Negative for color change.   Allergic/Immunologic: Negative for immunocompromised state.  Hematological: Does not bruise/bleed easily.   Allergies  Review of patient's allergies indicates no known allergies.  Home Medications   Prior to Admission medications   Medication Sig Start Date End Date Taking? Authorizing Provider  aspirin 81 MG chewable tablet Chew by mouth daily.    Historical Provider, MD  Omega-3 Fatty Acids (ULTRA OMEGA 3 PO) Take by mouth.    Historical Provider, MD  pravastatin (PRAVACHOL) 20 MG tablet Take 20 mg by mouth daily.    Historical Provider, MD  Vitamin D, Ergocalciferol, (DRISDOL) 50000 UNITS CAPS capsule Take 50,000 Units by mouth every 7 (seven) days.    Historical Provider, MD   BP 174/91 mmHg  Pulse 72  Temp(Src) 98.8 F (37.1 C) (Oral)  Resp 18  SpO2 99% Physical Exam  Constitutional: She appears well-developed and well-nourished. No distress.  HENT:  Head: Normocephalic and atraumatic.  Mouth/Throat: Uvula is midline and oropharynx is clear and moist. Mucous membranes are not dry. No uvula swelling. No oropharyngeal exudate, posterior oropharyngeal edema, posterior oropharyngeal erythema or tonsillar abscesses.  Right upper first bicuspid decayed below the level of the gingiva  Neck: Trachea normal, normal range of motion and phonation normal. Neck supple. No tracheal tenderness present. No rigidity. No tracheal deviation, no edema, no erythema and normal range of motion present.  Cardiovascular: Normal rate.   Pulmonary/Chest: Effort normal and breath sounds normal. No stridor.  Lymphadenopathy:    She has no cervical  adenopathy.  Neurological: She is alert.  Skin: She is not diaphoretic.  Nursing note and vitals reviewed.  ED Course  Procedures  DIAGNOSTIC STUDIES: Oxygen Saturation is 99% on RA, normal by my interpretation.    COORDINATION OF CARE: 4:18 PM Pt presents today due to a chipped tooth. Discussed next steps with pt and she agreed to the plan.   Labs  Review Labs Reviewed - No data to display  Imaging Review No results found.   EKG Interpretation None        MDM   Final diagnoses:  Dental caries  Pain, dental    Afebrile, nontoxic patient with new dental pain.  No obvious abscess.  Pt has chronic pain in this tooth and the level of decay likely made the tooth unstable, causing it to crack while she was eating.  The level of decay is very deep, likely exposing nerves.  She has no swelling or systemic symptoms concerning for underlying abscess.  Doubt deep space head or neck infection.  Doubt Ludwig's angina.  Pt declined dental block.  D/C home with pain medication and dental follow up.  Discussed findings, treatment, and follow up  with patient.  Pt given return precautions.  Pt verbalizes understanding and agrees with plan.        I personally performed the services described in this documentation, which was scribed in my presence. The recorded information has been reviewed and is accurate.    Trixie Dredge, PA-C 06/09/15 1719  Arby Barrette, MD 06/15/15 [phone]

## 2015-06-10 ENCOUNTER — Emergency Department (HOSPITAL_COMMUNITY)
Admission: EM | Admit: 2015-06-10 | Discharge: 2015-06-10 | Disposition: A | Discharge status: Home / Self Care | Payer: No Typology Code available for payment source | Attending: Emergency Medicine | Admitting: Emergency Medicine

## 2015-06-10 ENCOUNTER — Encounter (HOSPITAL_COMMUNITY): Payer: Self-pay | Admitting: Emergency Medicine

## 2015-06-10 DIAGNOSIS — R011 Cardiac murmur, unspecified: Secondary | ICD-10-CM | POA: Insufficient documentation

## 2015-06-10 DIAGNOSIS — K0381 Cracked tooth: Secondary | ICD-10-CM | POA: Insufficient documentation

## 2015-06-10 DIAGNOSIS — K047 Periapical abscess without sinus: Secondary | ICD-10-CM | POA: Diagnosis not present

## 2015-06-10 DIAGNOSIS — Z79899 Other long term (current) drug therapy: Secondary | ICD-10-CM | POA: Insufficient documentation

## 2015-06-10 DIAGNOSIS — R5383 Other fatigue: Secondary | ICD-10-CM | POA: Insufficient documentation

## 2015-06-10 DIAGNOSIS — F17200 Nicotine dependence, unspecified, uncomplicated: Secondary | ICD-10-CM | POA: Insufficient documentation

## 2015-06-10 DIAGNOSIS — R51 Headache: Secondary | ICD-10-CM | POA: Diagnosis not present

## 2015-06-10 DIAGNOSIS — E7800 Pure hypercholesterolemia, unspecified: Secondary | ICD-10-CM | POA: Diagnosis not present

## 2015-06-10 DIAGNOSIS — E785 Hyperlipidemia, unspecified: Secondary | ICD-10-CM | POA: Insufficient documentation

## 2015-06-10 DIAGNOSIS — K0889 Other specified disorders of teeth and supporting structures: Secondary | ICD-10-CM | POA: Diagnosis present

## 2015-06-10 DIAGNOSIS — Z7982 Long term (current) use of aspirin: Secondary | ICD-10-CM | POA: Insufficient documentation

## 2015-06-10 DIAGNOSIS — I10 Essential (primary) hypertension: Secondary | ICD-10-CM | POA: Diagnosis not present

## 2015-06-10 DIAGNOSIS — H5710 Ocular pain, unspecified eye: Secondary | ICD-10-CM | POA: Insufficient documentation

## 2015-06-10 DIAGNOSIS — R630 Anorexia: Secondary | ICD-10-CM | POA: Insufficient documentation

## 2015-06-10 MED ORDER — SODIUM CHLORIDE 0.9 % IV SOLN
3.0000 g | Freq: Once | INTRAVENOUS | Status: AC
  Administered 2015-06-10: 3 g via INTRAVENOUS
  Filled 2015-06-10: qty 3

## 2015-06-10 MED ORDER — AMOXICILLIN-POT CLAVULANATE 875-125 MG PO TABS: 1.0000 | ORAL_TABLET | Freq: Two times a day (BID) | ORAL | Status: DC

## 2015-06-10 NOTE — ED Notes (Signed)
Pt c/o worsening right upper tooth pain with swelling into face and eye

## 2015-06-10 NOTE — ED Provider Notes (Signed)
CSN: [id number]     Arrival date & time 06/10/15  0840 History   First MD Initiated Contact with Patient 06/10/15 [phone]     Chief Complaint  Patient presents with  . Dental Pain  . Facial Swelling     (Consider location/radiation/quality/duration/timing/severity/associated sxs/prior Treatment) HPI  Jaime Adams is a 54 year old woman with a PMH of HLD who presents with tooth pain and facial swelling. The problem started last Friday when she was eating dinner and bit into something hard, causing one of her right upper teeth to crack. Since then, the pain has progressively worsened and she went to the ED on 12/19. She was given analgesics and discharged home. This morning, the pain has intensified and is now associated with right facial swelling and sinus pain. She denies any fevers, chills, or night sweats. She continues to have difficulty eating, only eating crackers and drinking milk. She does not follow with a dentist. She is an every day smoker. She reports no known allergy to penicillins.  Past Medical History  Diagnosis Date  . Hypertension   . High cholesterol    History reviewed. No pertinent past surgical history. Family History  Problem Relation Age of Onset  . Hypertension Mother   . Cancer Maternal Aunt   . Alzheimer's disease Mother   . Heart attack Brother   . Cirrhosis Father   . Stroke Brother   . Aneurysm Brother   . Diabetes Brother    Social History  Substance Use Topics  . Smoking status: Current Every Day Smoker  . Smokeless tobacco: Never Used  . Alcohol Use: 0.0 oz/week    0 Standard drinks or equivalent per week   OB History    No data available     Review of Systems  Constitutional: Positive for appetite change and fatigue. Negative for fever.  HENT: Positive for dental problem, facial swelling and sinus pressure.   Eyes: Positive for pain.  Cardiovascular: Negative for chest pain.  Gastrointestinal: Negative for nausea, diarrhea and  constipation.  Genitourinary: Negative for dysuria.  Musculoskeletal: Negative for arthralgias.  Skin: Negative for rash.  Allergic/Immunologic: Negative for environmental allergies.  Neurological: Positive for headaches.  Psychiatric/Behavioral: Negative for dysphoric mood.      Allergies  Review of patient's allergies indicates no known allergies.  Home Medications   Prior to Admission medications   Medication Sig Start Date End Date Taking? Authorizing Provider  acetaminophen-codeine (TYLENOL #3) 300-30 MG tablet Take 1-2 tablets by mouth every 6 (six) hours as needed for moderate pain. 06/09/15   Trixie DredgeEmily West, PA-C  aspirin 81 MG chewable tablet Chew by mouth daily.    Historical Provider, MD  ibuprofen (ADVIL,MOTRIN) 800 MG tablet Take 1 tablet (800 mg total) by mouth every 8 (eight) hours as needed for mild pain or moderate pain. 06/09/15   Trixie DredgeEmily West, PA-C  Omega-3 Fatty Acids (ULTRA OMEGA 3 PO) Take by mouth.    Historical Provider, MD  pravastatin (PRAVACHOL) 20 MG tablet Take 20 mg by mouth daily.    Historical Provider, MD  Vitamin D, Ergocalciferol, (DRISDOL) 50000 UNITS CAPS capsule Take 50,000 Units by mouth every 7 (seven) days.    Historical Provider, MD   BP 130/86 mmHg  Pulse 75  Temp(Src) 98.5 F (36.9 C) (Oral)  Resp 18  SpO2 99% Physical Exam  Constitutional: She appears well-developed and well-nourished.  HENT:  Mouth/Throat: Oropharynx is clear and moist.  Swelling in right upper face, tender to palpation.  Eyes: Pupils are equal, round, and reactive to light. Right eye exhibits no discharge. No scleral icterus.  Neck: No JVD present.  Cardiovascular: Normal rate and regular rhythm.   2/6 systolic murmur.  Pulmonary/Chest: Effort normal and breath sounds normal. No respiratory distress. She has no wheezes.  Abdominal: Soft. Bowel sounds are normal. She exhibits no distension. There is no tenderness.  Musculoskeletal: She exhibits no edema or tenderness.   Lymphadenopathy:    She has no cervical adenopathy.  Neurological: She is alert. She has normal reflexes.  Skin: Skin is warm and dry.  Psychiatric: She has a normal mood and affect. Her behavior is normal.    ED Course  Procedures (including critical care time) Labs Review Labs Reviewed - No data to display  Imaging Review No results found. I have personally reviewed and evaluated these images and lab results as part of my medical decision-making.   EKG Interpretation None      MDM   Final diagnoses:  None    She is afebrile with stable vital signs. No suspicion of sepsis at this time, but she likely has a local infection. We will cover her with one dose of zosyn in the ED  Discharged on a 10 day regimen on Augmentin. Told to seek medical attention if her pain worsens after her course of antibiotics or if she develops a fever.     Benjiman Core, MD 06/11/15 2231

## 2015-06-10 NOTE — Discharge Instructions (Signed)
Jaime Adams, you were seen in the ED for a dental infection. We have prescribed you antibiotics (Augmentin), to be taken twice daily for 10 days. If the swelling does not go down or you develop a fever, please seek medical attention.

## 2015-06-10 NOTE — ED Notes (Signed)
Pt is in stable condition upon d/c and ambulates from ED. 

## 2015-10-15 ENCOUNTER — Emergency Department (HOSPITAL_COMMUNITY)
Admission: EM | Admit: 2015-10-15 | Discharge: 2015-10-15 | Disposition: A | Discharge status: Home / Self Care | Payer: No Typology Code available for payment source | Attending: Emergency Medicine | Admitting: Emergency Medicine

## 2015-10-15 ENCOUNTER — Encounter (HOSPITAL_COMMUNITY): Payer: Self-pay | Admitting: Emergency Medicine

## 2015-10-15 DIAGNOSIS — Z79899 Other long term (current) drug therapy: Secondary | ICD-10-CM | POA: Insufficient documentation

## 2015-10-15 DIAGNOSIS — S199XXA Unspecified injury of neck, initial encounter: Secondary | ICD-10-CM | POA: Insufficient documentation

## 2015-10-15 DIAGNOSIS — F17200 Nicotine dependence, unspecified, uncomplicated: Secondary | ICD-10-CM | POA: Insufficient documentation

## 2015-10-15 DIAGNOSIS — Y998 Other external cause status: Secondary | ICD-10-CM | POA: Insufficient documentation

## 2015-10-15 DIAGNOSIS — Y9389 Activity, other specified: Secondary | ICD-10-CM | POA: Insufficient documentation

## 2015-10-15 DIAGNOSIS — Z792 Long term (current) use of antibiotics: Secondary | ICD-10-CM | POA: Insufficient documentation

## 2015-10-15 DIAGNOSIS — W19XXXA Unspecified fall, initial encounter: Secondary | ICD-10-CM

## 2015-10-15 DIAGNOSIS — M542 Cervicalgia: Secondary | ICD-10-CM

## 2015-10-15 DIAGNOSIS — W108XXA Fall (on) (from) other stairs and steps, initial encounter: Secondary | ICD-10-CM | POA: Insufficient documentation

## 2015-10-15 DIAGNOSIS — Z7982 Long term (current) use of aspirin: Secondary | ICD-10-CM | POA: Insufficient documentation

## 2015-10-15 DIAGNOSIS — Y9289 Other specified places as the place of occurrence of the external cause: Secondary | ICD-10-CM | POA: Insufficient documentation

## 2015-10-15 DIAGNOSIS — E7800 Pure hypercholesterolemia, unspecified: Secondary | ICD-10-CM | POA: Insufficient documentation

## 2015-10-15 DIAGNOSIS — I10 Essential (primary) hypertension: Secondary | ICD-10-CM | POA: Insufficient documentation

## 2015-10-15 MED ORDER — METHOCARBAMOL 500 MG PO TABS
500.0000 mg | ORAL_TABLET | Freq: Once | ORAL | Status: AC
  Administered 2015-10-15: 500 mg via ORAL
  Filled 2015-10-15: qty 1

## 2015-10-15 MED ORDER — IBUPROFEN 600 MG PO TABS: 600.0000 mg | ORAL_TABLET | Freq: Four times a day (QID) | ORAL | Status: DC | PRN

## 2015-10-15 MED ORDER — IBUPROFEN 400 MG PO TABS
800.0000 mg | ORAL_TABLET | Freq: Once | ORAL | Status: AC
  Administered 2015-10-15: 800 mg via ORAL
  Filled 2015-10-15: qty 2

## 2015-10-15 MED ORDER — METHOCARBAMOL 500 MG PO TABS: 500.0000 mg | ORAL_TABLET | Freq: Two times a day (BID) | ORAL | Status: DC

## 2015-10-15 NOTE — Discharge Instructions (Signed)
1. Medications: ibuprofen, robaxin, usual home medications 2. Treatment: rest, drink plenty of fluids 3. Follow Up: please followup with your primary doctor in 2-3 days for discussion of your diagnoses and further evaluation after today's visit; if you do not have a primary care doctor use the phone number listed in your discharge paperwork to find one; please return to the ER for severe headache, increased pain, numbness, weakness, new or worsening symptoms    Musculoskeletal Pain Musculoskeletal pain is muscle and boney aches and pains. These pains can occur in any part of the body. Your caregiver may treat you without knowing the cause of the pain. They may treat you if blood or urine tests, X-rays, and other tests were normal.  CAUSES There is often not a definite cause or reason for these pains. These pains may be caused by a type of germ (virus). The discomfort may also come from overuse. Overuse includes working out too hard when your body is not fit. Boney aches also come from weather changes. Bone is sensitive to atmospheric pressure changes. HOME CARE INSTRUCTIONS   Ask when your test results will be ready. Make sure you get your test results.  Only take over-the-counter or prescription medicines for pain, discomfort, or fever as directed by your caregiver. If you were given medications for your condition, do not drive, operate machinery or power tools, or sign legal documents for 24 hours. Do not drink alcohol. Do not take sleeping pills or other medications that may interfere with treatment.  Continue all activities unless the activities cause more pain. When the pain lessens, slowly resume normal activities. Gradually increase the intensity and duration of the activities or exercise.  During periods of severe pain, bed rest may be helpful. Lay or sit in any position that is comfortable.  Putting ice on the injured area.  Put ice in a bag.  Place a towel between your skin and the  bag.  Leave the ice on for 15 to 20 minutes, 3 to 4 times a day.  Follow up with your caregiver for continued problems and no reason can be found for the pain. If the pain becomes worse or does not go away, it may be necessary to repeat tests or do additional testing. Your caregiver may need to look further for a possible cause. SEEK IMMEDIATE MEDICAL CARE IF:  You have pain that is getting worse and is not relieved by medications.  You develop chest pain that is associated with shortness or breath, sweating, feeling sick to your stomach (nauseous), or throw up (vomit).  Your pain becomes localized to the abdomen.  You develop any new symptoms that seem different or that concern you. MAKE SURE YOU:   Understand these instructions.  Will watch your condition.  Will get help right away if you are not doing well or get worse.   This information is not intended to replace advice given to you by your health care provider. Make sure you discuss any questions you have with your health care provider.   Document Released: 06/07/2005 Document Revised: 08/30/2011 Document Reviewed: 02/09/2013 Elsevier Interactive Patient Education [employer]

## 2015-10-15 NOTE — ED Provider Notes (Signed)
CSN: [id number]     Arrival date & time 10/15/15  4098 History  By signing my name below, I, Tanda Rockers, attest that this documentation has been prepared under the direction and in the presence of Glean Hess, [address] Electronically Signed: Tanda Rockers, ED Scribe. 10/15/2015. 9:25 AM.   Chief Complaint  Patient presents with  . Fall    The history is provided by the patient. No language interpreter was used.     HPI Comments: Janitza Revuelta Hall-Ching is a 55 y.o. female who presents to the Emergency Department complaining of gradual onset, constant, posterior neck pain s/p ground level fall that occurred yesterday morning. Pt reports that she slipped and fell down 3 steps yesterday and hit her head. No LOC. She has been able to ambulate since the fall. Her neck pain is exacerbated with movement.  Pt has been taking ibuprofen without relief. Denies dizziness, lightheadedness, visual changes, headache, weakness, numbness, tingling, back pain, nausea, vomiting, or any other associated symptoms. Pt is not on any anticoagulants.    Past Medical History  Diagnosis Date  . Hypertension   . High cholesterol    History reviewed. No pertinent past surgical history. Family History  Problem Relation Age of Onset  . Hypertension Mother   . Cancer Maternal Aunt   . Alzheimer's disease Mother   . Heart attack Brother   . Cirrhosis Father   . Stroke Brother   . Aneurysm Brother   . Diabetes Brother    Social History  Substance Use Topics  . Smoking status: Current Every Day Smoker  . Smokeless tobacco: Never Used  . Alcohol Use: 0.0 oz/week    0 Standard drinks or equivalent per week   OB History    No data available      Review of Systems  Eyes: Negative for visual disturbance.  Gastrointestinal: Negative for nausea and vomiting.  Musculoskeletal: Positive for neck pain. Negative for back pain and gait problem.  Neurological: Negative for dizziness, syncope, weakness,  light-headedness, numbness and headaches.    Allergies  Review of patient's allergies indicates no known allergies.  Home Medications   Prior to Admission medications   Medication Sig Start Date End Date Taking? Authorizing Provider  acetaminophen-codeine (TYLENOL #3) 300-30 MG tablet Take 1-2 tablets by mouth every 6 (six) hours as needed for moderate pain. 06/09/15   Trixie Dredge, PA-C  amoxicillin-clavulanate (AUGMENTIN) 875-125 MG tablet Take 1 tablet by mouth 2 (two) times daily. Take until complete. 06/10/15   Ruben Im, MD  aspirin 81 MG chewable tablet Chew 81 mg by mouth daily.     Historical Provider, MD  ibuprofen (ADVIL,MOTRIN) 600 MG tablet Take 1 tablet (600 mg total) by mouth every 6 (six) hours as needed. 10/15/15   Mady Gemma, PA-C  methocarbamol (ROBAXIN) 500 MG tablet Take 1 tablet (500 mg total) by mouth 2 (two) times daily. 10/15/15   Mady Gemma, PA-C  Omega-3 Fatty Acids (ULTRA OMEGA 3 PO) Take 1 tablet by mouth daily.     Historical Provider, MD  pravastatin (PRAVACHOL) 20 MG tablet Take 20 mg by mouth daily.    Historical Provider, MD    BP 151/90 mmHg  Pulse 85  Temp(Src) 98.7 F (37.1 C) (Oral)  Resp 18  SpO2 100%  Physical Exam  Constitutional: She is oriented to person, place, and time. She appears well-developed and well-nourished. No distress.  HENT:  Head: Normocephalic and atraumatic. Head is without raccoon's eyes, without Battle's sign,  without abrasion, without contusion and without laceration.  Right Ear: Hearing, tympanic membrane, external ear and ear canal normal. No drainage. No hemotympanum.  Left Ear: Hearing, tympanic membrane, external ear and ear canal normal. No drainage. No hemotympanum.  Nose: Nose normal.  Mouth/Throat: Uvula is midline, oropharynx is clear and moist and mucous membranes are normal.  Eyes: Conjunctivae, EOM and lids are normal. Pupils are equal, round, and reactive to light. Right eye exhibits no  discharge. Left eye exhibits no discharge. No scleral icterus.  Neck: Normal range of motion. Neck supple. Muscular tenderness present. No spinous process tenderness present.  TTP to cervical paraspinal muscles bilaterally. No midline tenderness, step-off, or deformity.  Cardiovascular: Normal rate, regular rhythm, normal heart sounds, intact distal pulses and normal pulses.   Pulmonary/Chest: Effort normal and breath sounds normal. No respiratory distress. She has no wheezes. She has no rales. She exhibits no tenderness.  Abdominal: Soft. Normal appearance and bowel sounds are normal. She exhibits no distension and no mass. There is no tenderness. There is no rigidity, no rebound and no guarding.  Musculoskeletal: Normal range of motion. She exhibits no edema or tenderness.  No TTP to upper extremities, pelvis, or lower extremities bilaterally. No TTP to cervical, thoracic, or lumbar spine. Full range of motion.  Neurological: She is alert and oriented to person, place, and time. She has normal strength. No cranial nerve deficit or sensory deficit. GCS eye subscore is 4. GCS verbal subscore is 5. GCS motor subscore is 6.  Patient ambulates without difficulty.  Skin: Skin is warm, dry and intact. No rash noted. She is not diaphoretic. No erythema. No pallor.  Psychiatric: She has a normal mood and affect. Her speech is normal and behavior is normal.  Nursing note and vitals reviewed.   ED Course  Procedures (including critical care time)  DIAGNOSTIC STUDIES: Oxygen Saturation is 100% on RA, normal by my interpretation.    COORDINATION OF CARE: 9:21 AM-Discussed treatment plan which includes Rx NSAIDs and muscle relaxants with pt at bedside and pt agreed to plan.   Labs Review Labs Reviewed - No data to display  Imaging Review No results found.   EKG Interpretation None      MDM   Final diagnoses:  Fall, initial encounter  Neck pain    55 year old female presents with neck  pain after falling down 3 stairs yesterday. States she hit her head on a door, though denies LOC. Denies headache, lightheadedness, dizziness, numbness, weakness, paresthesia. Patient is afebrile. Vital signs stable. GCS 15. Head normocephalic and atraumatic. Normal neuro exam with no focal deficit. TTP to cervical paraspinal muscles bilaterally with no midline tenderness, step-off, or deformity. Patient moves all extremities and ambulates without difficulty.   [place] head CT rule negative, no midline cervical tenderness on exam, do not feel imaging is indicated at this time. Symptoms likely muscular. Will treat with anti-inflammatory and muscle relaxant. Patient to follow up with PCP. Strict return precautions discussed. Patient verbalizes her understanding and is in agreement with plan.  BP 151/90 mmHg  Pulse 85  Temp(Src) 98.7 F (37.1 C) (Oral)  Resp 18  SpO2 100%  I personally performed the services described in this documentation, which was scribed in my presence. The recorded information has been reviewed and is accurate.       Mady Gemmalizabeth C Westfall, PA-C 10/15/15 1205  Gerhard Munchobert Lockwood, MD 10/17/15 [phone]

## 2015-10-15 NOTE — ED Notes (Signed)
Pt sts fall yesterday down three stairs and now having neck pain; pt denies LOC but sts did hit her head; no blood thinners

## 2016-03-18 ENCOUNTER — Encounter (HOSPITAL_COMMUNITY): Payer: Self-pay | Admitting: Emergency Medicine

## 2016-03-18 ENCOUNTER — Inpatient Hospital Stay (HOSPITAL_COMMUNITY)
Admission: EM | Admit: 2016-03-18 | Discharge: 2016-03-21 | DRG: 603 | Disposition: A | Discharge status: Home / Self Care | Payer: BLUE CROSS/BLUE SHIELD | Attending: Family Medicine | Admitting: Family Medicine

## 2016-03-18 ENCOUNTER — Emergency Department (HOSPITAL_COMMUNITY): Payer: BLUE CROSS/BLUE SHIELD

## 2016-03-18 DIAGNOSIS — L0390 Cellulitis, unspecified: Secondary | ICD-10-CM

## 2016-03-18 DIAGNOSIS — Z820 Family history of epilepsy and other diseases of the nervous system: Secondary | ICD-10-CM

## 2016-03-18 DIAGNOSIS — L03211 Cellulitis of face: Secondary | ICD-10-CM | POA: Diagnosis not present

## 2016-03-18 DIAGNOSIS — I10 Essential (primary) hypertension: Secondary | ICD-10-CM | POA: Diagnosis present

## 2016-03-18 DIAGNOSIS — E7800 Pure hypercholesterolemia, unspecified: Secondary | ICD-10-CM | POA: Diagnosis present

## 2016-03-18 DIAGNOSIS — K047 Periapical abscess without sinus: Secondary | ICD-10-CM | POA: Diagnosis not present

## 2016-03-18 DIAGNOSIS — Z8249 Family history of ischemic heart disease and other diseases of the circulatory system: Secondary | ICD-10-CM

## 2016-03-18 DIAGNOSIS — Z823 Family history of stroke: Secondary | ICD-10-CM

## 2016-03-18 DIAGNOSIS — F17210 Nicotine dependence, cigarettes, uncomplicated: Secondary | ICD-10-CM | POA: Diagnosis present

## 2016-03-18 DIAGNOSIS — Z833 Family history of diabetes mellitus: Secondary | ICD-10-CM

## 2016-03-18 HISTORY — DX: Cellulitis, unspecified: L03.90

## 2016-03-18 HISTORY — DX: Cellulitis of face: L03.211

## 2016-03-18 LAB — BASIC METABOLIC PANEL
ANION GAP: 9 (ref 5–15)
BUN: 5 mg/dL — ABNORMAL LOW (ref 6–20)
CALCIUM: 9.2 mg/dL (ref 8.9–10.3)
CO2: 23 mmol/L (ref 22–32)
CREATININE: 0.8 mg/dL (ref 0.44–1.00)
Chloride: 105 mmol/L (ref 101–111)
GFR calc non Af Amer: >60 mL/min (ref >60)
Glucose, Bld: 98 mg/dL (ref 65–99)
Potassium: 3.8 mmol/L (ref 3.5–5.1)
SODIUM: 137 mmol/L (ref 135–145)

## 2016-03-18 LAB — CBC WITH DIFFERENTIAL/PLATELET
BASOS ABS: 0 10*3/uL (ref 0.0–0.1)
BASOS PCT: 0 %
EOS ABS: 0 10*3/uL (ref 0.0–0.7)
Eosinophils Relative: 0 %
HEMATOCRIT: 38.2 % (ref 36.0–46.0)
HEMOGLOBIN: 12.7 g/dL (ref 12.0–15.0)
Lymphocytes Relative: 15 %
Lymphs Abs: 1.7 10*3/uL (ref 0.7–4.0)
MCH: 31.3 pg (ref 26.0–34.0)
MCHC: 33.2 g/dL (ref 30.0–36.0)
MCV: 94.1 fL (ref 78.0–100.0)
MONO ABS: 0.6 10*3/uL (ref 0.1–1.0)
Monocytes Relative: 5 %
NEUTROS ABS: 8.8 10*3/uL — AB (ref 1.7–7.7)
NEUTROS PCT: 80 %
Platelets: 243 10*3/uL (ref 150–400)
RBC: 4.06 MIL/uL (ref 3.87–5.11)
RDW: 13.8 % (ref 11.5–15.5)
WBC: 11.1 10*3/uL — ABNORMAL HIGH (ref 4.0–10.5)

## 2016-03-18 MED ORDER — ENOXAPARIN SODIUM 40 MG/0.4ML ~~LOC~~ SOLN
40.0000 mg | SUBCUTANEOUS | Status: DC
  Administered 2016-03-19 – 2016-03-21 (×3): 40 mg via SUBCUTANEOUS
  Filled 2016-03-18 (×3): qty 0.4

## 2016-03-18 MED ORDER — ONDANSETRON HCL 4 MG/2ML IJ SOLN: 4.0000 mg | Freq: Three times a day (TID) | INTRAMUSCULAR | Status: AC | PRN

## 2016-03-18 MED ORDER — ONDANSETRON HCL 4 MG/2ML IJ SOLN
4.0000 mg | Freq: Once | INTRAMUSCULAR | Status: AC
Start: 2016-03-18 — End: 2016-03-18
  Administered 2016-03-18: 4 mg via INTRAVENOUS
  Filled 2016-03-18: qty 2

## 2016-03-18 MED ORDER — SODIUM CHLORIDE 0.9 % IV SOLN
INTRAVENOUS | Status: AC
  Administered 2016-03-18: 23:00:00 via INTRAVENOUS

## 2016-03-18 MED ORDER — VANCOMYCIN HCL IN DEXTROSE 1-5 GM/200ML-% IV SOLN
1000.0000 mg | Freq: Two times a day (BID) | INTRAVENOUS | Status: DC
  Administered 2016-03-19 – 2016-03-21 (×6): 1000 mg via INTRAVENOUS
  Filled 2016-03-18 (×6): qty 200

## 2016-03-18 MED ORDER — SODIUM CHLORIDE 0.9% FLUSH: 3.0000 mL | INTRAVENOUS | Status: DC | PRN

## 2016-03-18 MED ORDER — SODIUM CHLORIDE 0.9% FLUSH
3.0000 mL | Freq: Two times a day (BID) | INTRAVENOUS | Status: DC
  Administered 2016-03-18 – 2016-03-21 (×4): 3 mL via INTRAVENOUS

## 2016-03-18 MED ORDER — HYDROMORPHONE HCL 1 MG/ML IJ SOLN
0.5000 mg | Freq: Once | INTRAMUSCULAR | Status: AC
  Administered 2016-03-18: 0.5 mg via INTRAVENOUS
  Filled 2016-03-18: qty 1

## 2016-03-18 MED ORDER — ACETAMINOPHEN 325 MG PO TABS: 650.0000 mg | ORAL_TABLET | Freq: Four times a day (QID) | ORAL | Status: DC | PRN

## 2016-03-18 MED ORDER — SODIUM CHLORIDE 0.9 % IV SOLN: 250.0000 mL | INTRAVENOUS | Status: DC | PRN

## 2016-03-18 MED ORDER — PIPERACILLIN-TAZOBACTAM 3.375 G IVPB
3.3750 g | Freq: Three times a day (TID) | INTRAVENOUS | Status: DC
  Administered 2016-03-19 – 2016-03-21 (×8): 3.375 g via INTRAVENOUS
  Filled 2016-03-18 (×11): qty 50

## 2016-03-18 MED ORDER — CLINDAMYCIN PHOSPHATE 600 MG/50ML IV SOLN
600.0000 mg | Freq: Once | INTRAVENOUS | Status: AC
  Administered 2016-03-18: 600 mg via INTRAVENOUS
  Filled 2016-03-18: qty 50

## 2016-03-18 MED ORDER — HYDROMORPHONE HCL 1 MG/ML IJ SOLN: 0.5000 mg | INTRAMUSCULAR | Status: AC | PRN

## 2016-03-18 MED ORDER — IOPAMIDOL (ISOVUE-300) INJECTION 61%
INTRAVENOUS | Status: AC
  Administered 2016-03-18: 75 mL
  Filled 2016-03-18: qty 75

## 2016-03-18 MED ORDER — IBUPROFEN 400 MG PO TABS
400.0000 mg | ORAL_TABLET | Freq: Four times a day (QID) | ORAL | Status: DC | PRN
  Administered 2016-03-19 – 2016-03-21 (×5): 400 mg via ORAL
  Filled 2016-03-18 (×5): qty 1

## 2016-03-18 MED ORDER — ACETAMINOPHEN 650 MG RE SUPP: 650.0000 mg | Freq: Four times a day (QID) | RECTAL | Status: DC | PRN

## 2016-03-18 NOTE — H&P (Addendum)
TRH H&P   Patient Demographics:    Jaime Adams, is a 55 y.o. female  MRN: [id number]   DOB - August 17, 1960  Admit Date - 03/18/2016  Outpatient Primary MD for the patient is EDWARDS, Milford Cage, NP  Referring MD/NP/PA:   Debroah Baller   Outpatient Specialists:   Patient coming from: home  Chief Complaint  Patient presents with  . Abscess      HPI:    Jaime Adams  is a 55 y.o. female, w c/o right facial swelling starting last night.  No new medication.  No bug bite.  Tooth ache for about 2 days.  Upper molar per pt.  Denies fever, chills, cp, palp, sob, n/v, diarrhea, brbpr black stool. Pt presented to ED for evaluation.   In ED.  Pt was noted to have right facial cellulitis and CT maxillofacial showed a upper right incisor and premolar w cortical interruption 2.7x2x 0.8cm abscess.  Pt was tx with clindamycin in the ED x 1 dose. Pt will be admitted for cellulitis (facial ) as well as abscess.     Review of systems:    In addition to the HPI above,  No Fever-chills, No Headache, No changes with Vision or hearing, No problems swallowing food or Liquids, No Chest pain, Cough or Shortness of Breath, No Abdominal pain, No Nausea or Vommitting, Bowel movements are regular, No Blood in stool or Urine, No dysuria, No new skin rashes or bruises, No new joints pains-aches,  No new weakness, tingling, numbness in any extremity, No recent weight gain or loss, No polyuria, polydypsia or polyphagia, No significant Mental Stressors.  A full 10 point Review of Systems was done, except as stated above, all other Review of Systems were negative.   With Past History of the following :    Past Medical History:  Diagnosis Date  . High cholesterol   . Hypertension       History reviewed. No pertinent surgical history.    Social History:     Social History    Substance Use Topics  . Smoking status: Current Every Day Smoker    Packs/day: 1.00    Years: 20.00  . Smokeless tobacco: Never Used  . Alcohol use 0.0 oz/week     Lives - at home  Mobility [facility] without assistance.     Family History :     Family History  Problem Relation Age of Onset  . Hypertension Mother   . Alzheimer's disease Mother   . Cirrhosis Father   . Cancer Maternal Aunt   . Heart attack Brother   . Stroke Brother   . Aneurysm Brother   . Diabetes Brother       Home Medications:   Prior to Admission medications   Not on File     Allergies:    No Known Allergies   Physical  Exam:   Vitals  Blood pressure 140/72, pulse 67, temperature 98.8 F (37.1 C), temperature source Oral, resp. rate 14, height '5\' 9"'$  (1.753 m), weight 68 kg (150 lb), SpO2 99 %.   1. General  lying in bed in NAD,    2. Normal affect and insight, Not Suicidal or Homicidal, Awake Alert, Oriented X 3.  3. No F.N deficits, ALL C.Nerves Intact, Strength 5/5 all 4 extremities, Sensation intact all 4 extremities, Plantars down going.  4. Ears and Eyes appear Normal, Conjunctivae clear, PERRLA. Moist Oral Mucosa. R facial swelling and erythema, + slight warmth,  There was apparently pus from the right upper tooth.   5. Supple Neck, No JVD, No cervical lymphadenopathy appriciated, No Carotid Bruits.  6. Symmetrical Chest wall movement, Good air movement bilaterally, CTAB.  7. RRR,S1, S2, 2/ 6 sem rusb  8. Positive Bowel Sounds, Abdomen Soft, No tenderness, No organomegaly appriciated,No rebound -guarding or rigidity.  9.  No Cyanosis, Normal Skin Turgor,  10. Good muscle tone,  joints appear normal , no effusions, Normal ROM.  11. No Palpable Lymph Nodes in Neck or Axillae     Data Review:    CBC  Recent Labs Lab 03/18/16 1725  WBC 11.1*  HGB 12.7  HCT 38.2  PLT 243  MCV 94.1  MCH 31.3  MCHC 33.2  RDW 13.8  LYMPHSABS 1.7  MONOABS 0.6  EOSABS 0.0   BASOSABS 0.0   ------------------------------------------------------------------------------------------------------------------  Chemistries   Recent Labs Lab 03/18/16 2008  NA 137  K 3.8  CL 105  CO2 23  GLUCOSE 98  BUN 5*  CREATININE 0.80  CALCIUM 9.2   ------------------------------------------------------------------------------------------------------------------ estimated creatinine clearance is 82.1 mL/min (by C-G formula based on SCr of 0.8 mg/dL). ------------------------------------------------------------------------------------------------------------------ No results for input(s): TSH, T4TOTAL, T3FREE, THYROIDAB in the last 72 hours.  Invalid input(s): FREET3  Coagulation profile No results for input(s): INR, PROTIME in the last 168 hours. ------------------------------------------------------------------------------------------------------------------- No results for input(s): DDIMER in the last 72 hours. -------------------------------------------------------------------------------------------------------------------  Cardiac Enzymes No results for input(s): CKMB, TROPONINI, MYOGLOBIN in the last 168 hours.  Invalid input(s): CK ------------------------------------------------------------------------------------------------------------------ No results found for: BNP   ---------------------------------------------------------------------------------------------------------------  Urinalysis No results found for: COLORURINE, APPEARANCEUR, LABSPEC, PHURINE, GLUCOSEU, HGBUR, BILIRUBINUR, KETONESUR, PROTEINUR, UROBILINOGEN, NITRITE, LEUKOCYTESUR  ----------------------------------------------------------------------------------------------------------------   Imaging Results:    Ct Maxillofacial W Contrast  Result Date: 03/18/2016 CLINICAL DATA:  55 year old female with chipped tooth several months ago. Right-sided facial swelling since last night.  Initial encounter. EXAM: CT MAXILLOFACIAL WITH CONTRAST TECHNIQUE: Multidetector CT imaging of the maxillofacial structures was performed with intravenous contrast. Multiplanar CT image reconstructions were also generated. A small metallic BB was placed on the right temple in order to reliably differentiate right from left. CONTRAST:  Seventy-five cc ISOVUE-300 IOPAMIDOL (ISOVUE-300) INJECTION 61% COMPARISON:  06/27/2014 head CT and brain MR. FINDINGS: Osseous: Significant dental disease. Caries and periapical lucencies. Most notable periapical lucency upper right incisor/ premolar with cortical interruption upper right premolar level responsible for the patient's 2.7 x 2 x 0.8 cm abscess along the outer margin of the right alveolar ridge and overlying cellulitis. Cellulitis extends throughout the right cheek to just below the orbit along the right nose partially crossing midline. No septic thrombophlebitis of adjacent vein. Marked enlargement right styloid process and moderate prominence left styloid process. This may be an incidental finding although described with dysphagia (Eagle syndrome). C3-4 anterior osteophyte with impression upon the posterior pharynx with narrowing of the air column posterior to the  epiglottis. Orbits: No intraorbital extension of inflammatory process. The cellulitis abuts the undersurface of the right orbit/lacrimal duct. Sinuses: Mild mucosal thickening maxillary sinuses greater on the right. Soft tissues: As above. Limited intracranial: Negative. Mild plaque carotid bifurcation bilaterally. IMPRESSION: Significant dental disease. Caries and periapical lucencies. Most notable periapical lucency upper right incisor/ premolar with cortical interruption upper right premolar level responsible for the patient's 2.7 x 2 x 0.8 cm abscess along the outer margin of the right alveolar ridge and overlying cellulitis. Cellulitis extends throughout the right cheek to just below the orbit along the  right nose partially crossing midline. Marked enlargement right styloid process and moderate prominence left styloid process. This may be an incidental finding although described with dysphagia (Eagle syndrome). C3-4 anterior osteophyte with impression upon the posterior pharynx with narrowing of the air column posterior to the epiglottis. Electronically Signed   By: Genia Del M.D.   On: 03/18/2016 19:28     Assessment & Plan:    Active Problems:   Facial cellulitis    1. Cellulitis/ dental abscess Vanco iv pharmacy to dose Zosyn iv pharmacy to dose Check cbc , cmp in am Please consult dental in am Ibuprofen and tylenol for pain  2. Cardiac murmer Check echo Blood culture x2 check esr    DVT Prophylaxis  Lovenox - SCDs  AM Labs Ordered, also please review Full Orders  Family Communication: Admission, patients condition and plan of care including tests being ordered have been discussed with the patient who indicate understanding and agree with the plan and Code Status.  Code Status FULL CODE  Likely DC to  home  Condition GUARDED    Consults called: ENT per ED, but they will not consult.  No maxillofacial at [facility]  Admission status: inpatient  Time spent in minutes : 45 minutes   Jani Gravel M.D on 03/18/2016 at 9:59 PM  Between 7am to 7pm - Pager - [phone]  After 7pm go to [email or web address] - password [facility]  [facility] - Office  [phone]

## 2016-03-18 NOTE — ED Notes (Signed)
Patient transported to CT 

## 2016-03-18 NOTE — Progress Notes (Signed)
Pharmacy Antibiotic Note  Jaime Adams is a 55 y.o. female admitted on 03/18/2016 with cellulitis and abscess.  Pharmacy has been consulted for Vancomycin/Zosyn dosing. WBC 11.1, renal function good, other labs reviewed. Clindamycin x 1 in the ED.   Plan: -Vancomycin 1000 mg IV q12h -Zosyn 3.375G IV q8h to be infused over 4 hours -Trend WBC, temp, renal function  -Drug levels as indicated   Height: 5\' 10"  (177.8 cm) Weight: 146 lb 6.2 oz (66.4 kg) IBW/kg (Calculated) : 68.5  Temp (24hrs), Avg:98.5 F (36.9 C), Min:98.1 F (36.7 C), Max:98.8 F (37.1 C)   Recent Labs Lab 03/18/16 1725 03/18/16 2008  WBC 11.1*  --   CREATININE  --  0.80    Estimated Creatinine Clearance: 82.3 mL/min (by C-G formula based on SCr of 0.8 mg/dL).    No Known Allergies  Jaime Adams, Jaime Adams 03/18/2016 11:40 PM

## 2016-03-18 NOTE — ED Triage Notes (Signed)
Pt c/o dental pain when she woke up this am face was swollen.  Pt c/o pain and swelling to right side of face

## 2016-03-18 NOTE — ED Provider Notes (Signed)
[facility]-EMERGENCY DEPT Provider Note   CSN: [id number] Arrival date & time: 03/18/16  1556   By signing my name below, I, Jaime Adams, attest that this documentation has been prepared under the direction and in the presence of [name], NP Electronically Signed: Soijett Adams, ED Scribe. 03/18/16. 4:43 PM.  History   Chief Complaint Chief Complaint  Patient presents with  . Abscess    HPI Jaime Adams is a 55 y.o. female with a PMHx of HTN who presents to the Emergency Department complaining of dental abscess to right upper mouth onset last night. Pt notes that she has chipped teeth to her right upper area. Pt rates her right upper mouth pain as a 5/10 at this time. Pt reports that she had issues with her right upper teeth 8 months ago and she was evaluated, prescribed abx, and referred to a dentist for further evaluation. Pt reports that she attempted to go to the dentist, but her insurance wouldn't cover the visit. Pt states that her right upper dental pain was alleviated with the abx Rx. Pt is having associated symptoms of right sided facial swelling, right sided HA, and right ear pain. She notes that she has tried ibuprofen with relief of her symptoms. She denies fever, chills, drainage, nausea, vomiting, trouble swallowing, and any other symptoms.     The history is provided by the patient. No language interpreter was used.  Abscess  Location:  Mouth Mouth abscess location: right upper molars. Abscess quality: painful   Abscess quality: not draining   Red streaking: no   Duration:  1 day Progression:  Worsening Pain details:    Quality:  Unable to specify   Severity:  Mild   Duration:  1 day   Timing:  Constant   Progression:  Worsening Chronicity:  New Relieved by:  NSAIDs (ibuprofen) Worsened by:  Nothing Ineffective treatments:  None tried Associated symptoms: headaches (right sided)   Associated symptoms: no fever, no nausea and no vomiting      Past  Medical History:  Diagnosis Date  . High cholesterol   . Hypertension     There are no active problems to display for this patient.   History reviewed. No pertinent surgical history.  OB History    No data available       Home Medications    Prior to Admission medications   Medication Sig Start Date End Date Taking? Authorizing Provider  acetaminophen-codeine (TYLENOL #3) 300-30 MG tablet Take 1-2 tablets by mouth every 6 (six) hours as needed for moderate pain. 06/09/15   Trixie Dredge, PA-C  amoxicillin-clavulanate (AUGMENTIN) 875-125 MG tablet Take 1 tablet by mouth 2 (two) times daily. Take until complete. 06/10/15   Ruben Im, MD  aspirin 81 MG chewable tablet Chew 81 mg by mouth daily.     Historical Provider, MD  ibuprofen (ADVIL,MOTRIN) 600 MG tablet Take 1 tablet (600 mg total) by mouth every 6 (six) hours as needed. 10/15/15   Mady Gemma, PA-C  methocarbamol (ROBAXIN) 500 MG tablet Take 1 tablet (500 mg total) by mouth 2 (two) times daily. 10/15/15   Mady Gemma, PA-C  Omega-3 Fatty Acids (ULTRA OMEGA 3 PO) Take 1 tablet by mouth daily.     Historical Provider, MD  pravastatin (PRAVACHOL) 20 MG tablet Take 20 mg by mouth daily.    Historical Provider, MD    Family History Family History  Problem Relation Age of Onset  . Hypertension Mother   .  Alzheimer's disease Mother   . Cirrhosis Father   . Cancer Maternal Aunt   . Heart attack Brother   . Stroke Brother   . Aneurysm Brother   . Diabetes Brother     Social History Social History  Substance Use Topics  . Smoking status: Current Every Day Smoker  . Smokeless tobacco: Never Used  . Alcohol use 0.0 oz/week     Allergies   Review of patient's allergies indicates no known allergies.   Review of Systems Review of Systems  Constitutional: Negative for fever.  HENT: Positive for dental problem (right upper) and facial swelling (right upper). Negative for trouble swallowing.         Abscess to right upper mouth  Eyes: Negative for pain.  Respiratory: Negative for shortness of breath.   Gastrointestinal: Negative for abdominal pain, nausea and vomiting.  Musculoskeletal: Negative for neck stiffness.  Skin: Negative for rash.  Neurological: Positive for headaches (right sided).  Hematological: Positive for adenopathy.  Psychiatric/Behavioral: Negative for confusion. The patient is not nervous/anxious.      Physical Exam Updated Vital Signs BP 140/72 (BP Location: Right Arm)   Pulse 67   Temp 98.8 F (37.1 C) (Oral)   Resp 14   Ht 5\' 9"  (1.753 m)   Wt 150 lb (68 kg)   SpO2 99%   BMI 22.15 kg/m   Physical Exam  Constitutional: She is oriented to person, place, and time. She appears well-developed and well-nourished. No distress.  HENT:  Head: Normocephalic and atraumatic.  Right Ear: Tympanic membrane, external ear and ear canal normal.  Left Ear: Tympanic membrane, external ear and ear canal normal.  Mouth/Throat: Uvula is midline, oropharynx is clear and moist and mucous membranes are normal. Abnormal dentition. No posterior oropharyngeal edema or posterior oropharyngeal erythema.  Broken and decayed teeth to right upper molars. Swelling, erythema, and tenderness to the gums surrounding the teeth. Moderate/severe facial swelling.    Eyes: EOM are normal.  Neck: Neck supple.  Cardiovascular: Normal rate and regular rhythm.  Exam reveals no gallop and no friction rub.   Murmur heard. Pulmonary/Chest: Effort normal and breath sounds normal. No respiratory distress. She has no wheezes. She has no rales.  Abdominal: Soft. There is no tenderness.  Musculoskeletal: Normal range of motion.  Lymphadenopathy:    She has cervical adenopathy.  Bilateral cervical LAD, right > left.   Neurological: She is alert and oriented to person, place, and time.  Skin: Skin is warm and dry.  Psychiatric: She has a normal mood and affect. Her behavior is normal.  Nursing note  and vitals reviewed.    ED Treatments / Results  DIAGNOSTIC STUDIES: Oxygen Saturation is 99% on RA, nl by my interpretation.    COORDINATION OF CARE: 4:44 PM Discussed treatment plan with pt at bedside which includes CT maxillofacial, dilaudid, zofran, clindamycin, labs, and pt agreed to plan.   8:56 PM- Consult with Hospitalist, who agrees to admitting the patient. Hospitalist also recommends consult with ENT prior to admission.  9:11 PM- Consult with ENT specialist, Dr. Jenne Pane who informs that he doesn't take call for dental or dental abscess issues.  9:36 PM- Consult with Hospitalist, who agrees to admitting the patient and will see pt in the ED.   Labs (all labs ordered are listed, but only abnormal results are displayed) Labs Reviewed  CBC WITH DIFFERENTIAL/PLATELET - Abnormal; Notable for the following:       Result Value   WBC 11.1 (*)  Neutro Abs 8.8 (*)    All other components within normal limits  BASIC METABOLIC PANEL - Abnormal; Notable for the following:    BUN 5 (*)    All other components within normal limits    Radiology Ct Maxillofacial W Contrast  Result Date: 03/18/2016 CLINICAL DATA:  55 year old female with chipped tooth several months ago. Right-sided facial swelling since last night. Initial encounter. EXAM: CT MAXILLOFACIAL WITH CONTRAST TECHNIQUE: Multidetector CT imaging of the maxillofacial structures was performed with intravenous contrast. Multiplanar CT image reconstructions were also generated. A small metallic BB was placed on the right temple in order to reliably differentiate right from left. CONTRAST:  Seventy-five cc ISOVUE-300 IOPAMIDOL (ISOVUE-300) INJECTION 61% COMPARISON:  06/27/2014 head CT and brain MR. FINDINGS: Osseous: Significant dental disease. Caries and periapical lucencies. Most notable periapical lucency upper right incisor/ premolar with cortical interruption upper right premolar level responsible for the patient's 2.7 x 2 x 0.8  cm abscess along the outer margin of the right alveolar ridge and overlying cellulitis. Cellulitis extends throughout the right cheek to just below the orbit along the right nose partially crossing midline. No septic thrombophlebitis of adjacent vein. Marked enlargement right styloid process and moderate prominence left styloid process. This may be an incidental finding although described with dysphagia (Eagle syndrome). C3-4 anterior osteophyte with impression upon the posterior pharynx with narrowing of the air column posterior to the epiglottis. Orbits: No intraorbital extension of inflammatory process. The cellulitis abuts the undersurface of the right orbit/lacrimal duct. Sinuses: Mild mucosal thickening maxillary sinuses greater on the right. Soft tissues: As above. Limited intracranial: Negative. Mild plaque carotid bifurcation bilaterally. IMPRESSION: Significant dental disease. Caries and periapical lucencies. Most notable periapical lucency upper right incisor/ premolar with cortical interruption upper right premolar level responsible for the patient's 2.7 x 2 x 0.8 cm abscess along the outer margin of the right alveolar ridge and overlying cellulitis. Cellulitis extends throughout the right cheek to just below the orbit along the right nose partially crossing midline. Marked enlargement right styloid process and moderate prominence left styloid process. This may be an incidental finding although described with dysphagia (Eagle syndrome). C3-4 anterior osteophyte with impression upon the posterior pharynx with narrowing of the air column posterior to the epiglottis. Electronically Signed   By: Lacy DuverneySteven  Olson M.D.   On: 03/18/2016 19:28    Procedures Procedures (including critical care time)  Medications Ordered in ED Medications  clindamycin (CLEOCIN) IVPB 600 mg (0 mg Intravenous Stopped 03/18/16 1755)  ondansetron (ZOFRAN) injection 4 mg (4 mg Intravenous Given 03/18/16 1713)  HYDROmorphone  (DILAUDID) injection 0.5 mg (0.5 mg Intravenous Given 03/18/16 1713)  iopamidol (ISOVUE-300) 61 % injection (75 mLs  Contrast Given 03/18/16 1842)     Initial Impression / Assessment and Plan / ED Course  I have reviewed the triage vital signs and the nursing notes.  Pertinent labs & imaging results that were available during my care of the patient were reviewed by me and considered in my medical decision making (see chart for details).  Clinical Course   Dr. Verdie MosherLiu in to examine the patient.  Final Clinical Impressions(s) / ED Diagnoses  55 y.o. female with dental abscess, facial swelling and pain agrees to stay over night for continued antibiotics and observation.  Final diagnoses:  Facial cellulitis  Dental abscess    New Prescriptions New Prescriptions   No medications on file   I personally performed the services described in this documentation, which was scribed in my  presence. The recorded information has been reviewed and is accurate.     [place], [place] 03/18/16 2156    Lavera Guise, MD 03/19/16 [phone]

## 2016-03-19 DIAGNOSIS — I10 Essential (primary) hypertension: Secondary | ICD-10-CM | POA: Diagnosis present

## 2016-03-19 DIAGNOSIS — Z833 Family history of diabetes mellitus: Secondary | ICD-10-CM | POA: Diagnosis not present

## 2016-03-19 DIAGNOSIS — K047 Periapical abscess without sinus: Secondary | ICD-10-CM | POA: Diagnosis present

## 2016-03-19 DIAGNOSIS — L03211 Cellulitis of face: Principal | ICD-10-CM

## 2016-03-19 DIAGNOSIS — F17210 Nicotine dependence, cigarettes, uncomplicated: Secondary | ICD-10-CM | POA: Diagnosis present

## 2016-03-19 DIAGNOSIS — R011 Cardiac murmur, unspecified: Secondary | ICD-10-CM | POA: Diagnosis not present

## 2016-03-19 DIAGNOSIS — Z820 Family history of epilepsy and other diseases of the nervous system: Secondary | ICD-10-CM | POA: Diagnosis not present

## 2016-03-19 DIAGNOSIS — Z823 Family history of stroke: Secondary | ICD-10-CM | POA: Diagnosis not present

## 2016-03-19 DIAGNOSIS — Z8249 Family history of ischemic heart disease and other diseases of the circulatory system: Secondary | ICD-10-CM | POA: Diagnosis not present

## 2016-03-19 DIAGNOSIS — E7800 Pure hypercholesterolemia, unspecified: Secondary | ICD-10-CM | POA: Diagnosis present

## 2016-03-19 LAB — COMPREHENSIVE METABOLIC PANEL
ALBUMIN: 4.1 g/dL (ref 3.5–5.0)
ALK PHOS: 69 U/L (ref 38–126)
ALT: 9 U/L — ABNORMAL LOW (ref 14–54)
ANION GAP: 10 (ref 5–15)
AST: 20 U/L (ref 15–41)
BILIRUBIN TOTAL: 0.7 mg/dL (ref 0.3–1.2)
BUN: 6 mg/dL (ref 6–20)
CALCIUM: 9.3 mg/dL (ref 8.9–10.3)
CO2: 22 mmol/L (ref 22–32)
Chloride: 106 mmol/L (ref 101–111)
Creatinine, Ser: 0.78 mg/dL (ref 0.44–1.00)
GFR calc Af Amer: >60 mL/min (ref >60)
GLUCOSE: 86 mg/dL (ref 65–99)
Potassium: 3.5 mmol/L (ref 3.5–5.1)
Sodium: 138 mmol/L (ref 135–145)
TOTAL PROTEIN: 7.5 g/dL (ref 6.5–8.1)

## 2016-03-19 LAB — CBC
HEMATOCRIT: 39.5 % (ref 36.0–46.0)
HEMOGLOBIN: 13.1 g/dL (ref 12.0–15.0)
MCH: 31.3 pg (ref 26.0–34.0)
MCHC: 33.2 g/dL (ref 30.0–36.0)
MCV: 94.3 fL (ref 78.0–100.0)
Platelets: 201 10*3/uL (ref 150–400)
RBC: 4.19 MIL/uL (ref 3.87–5.11)
RDW: 13.7 % (ref 11.5–15.5)
WBC: 12.3 10*3/uL — ABNORMAL HIGH (ref 4.0–10.5)

## 2016-03-19 LAB — SEDIMENTATION RATE: SED RATE: 9 mm/h (ref 0–22)

## 2016-03-19 NOTE — Progress Notes (Signed)
PROGRESS NOTE    Jaime Adams  ZOX:[id number] DOB: [birth year] DOA: 03/18/2016 PCP: Grayce Sessions, NP   Brief Narrative:  55 y/o presenting with dental abscess  Assessment & Plan:   Active Problems:   Facial cellulitis/Cellulitis - reviewed reported results of CT scan - Continue antibiotics. No dentist available for consult today. - Continue current antibiotic regimen. Will continue IV antibiotics for another day. Consider switching to clindamycin soon   DVT prophylaxis:  Lovenox Code Status: Full Family Communication: d/c patient directly Disposition Plan: Pending improvement in condition.  Consultants:   None   Procedures: None   Antimicrobials: Zosyn and Vancomycin    Subjective: Pt has no new complaints. No acute issues overnight. Wants to know when she can go home.  Objective: Vitals:   03/18/16 2130 03/18/16 2313 03/19/16 0357 03/19/16 0924  BP: 133/63 118/76 (!) 102/57 108/70  Pulse: 86 79 72 71  Resp: 18 18 20 18   Temp:  98.1 F (36.7 C) 99.6 F (37.6 C) 99.2 F (37.3 C)  TempSrc:    Oral  SpO2: 99% 100% 97% 99%  Weight:  66.4 kg (146 lb 6.2 oz)    Height:  5\' 10"  (1.778 m)      Intake/Output Summary (Last 24 hours) at 03/19/16 1625 Last data filed at 03/19/16 1413  Gross per 24 hour  Intake          1913.75 ml  Output              500 ml  Net          1413.75 ml   Filed Weights   03/18/16 1610 03/18/16 2313  Weight: 68 kg (150 lb) 66.4 kg (146 lb 6.2 oz)    Examination:  General exam: Appears calm and comfortable, in nad. Respiratory system: Clear to auscultation. Respiratory effort normal. Cardiovascular system: S1 & S2 heard, RRR. No JVD, murmurs, rubs, gallops or clicks. No pedal edema. Gastrointestinal system: Abdomen is nondistended, soft and nontender. No organomegaly or masses felt. Normal bowel sounds heard. Central nervous system: Alert and oriented. No focal neurological deficits. Extremities: Symmetric 5 x 5  power. Skin: right cheek swollen when compared to the left, Erythema and calor present at right side of face Psychiatry: Judgement and insight appear normal. Mood & affect appropriate.   Data Reviewed: I have personally reviewed following labs and imaging studies  CBC:  Recent Labs Lab 03/18/16 1725 03/19/16 0515  WBC 11.1* 12.3*  NEUTROABS 8.8*  --   HGB 12.7 13.1  HCT 38.2 39.5  MCV 94.1 94.3  PLT 243 201   Basic Metabolic Panel:  Recent Labs Lab 03/18/16 2008 03/19/16 0515  NA 137 138  K 3.8 3.5  CL 105 106  CO2 23 22  GLUCOSE 98 86  BUN 5* 6  CREATININE 0.80 0.78  CALCIUM 9.2 9.3   GFR: Estimated Creatinine Clearance: 82.3 mL/min (by C-G formula based on SCr of 0.78 mg/dL). Liver Function Tests:  Recent Labs Lab 03/19/16 0515  AST 20  ALT 9*  ALKPHOS 69  BILITOT 0.7  PROT 7.5  ALBUMIN 4.1   No results for input(s): LIPASE, AMYLASE in the last 168 hours. No results for input(s): AMMONIA in the last 168 hours. Coagulation Profile: No results for input(s): INR, PROTIME in the last 168 hours. Cardiac Enzymes: No results for input(s): CKTOTAL, CKMB, CKMBINDEX, TROPONINI in the last 168 hours. BNP (last 3 results) No results for input(s): PROBNP in the last 8760  hours. HbA1C: No results for input(s): HGBA1C in the last 72 hours. CBG: No results for input(s): GLUCAP in the last 168 hours. Lipid Profile: No results for input(s): CHOL, HDL, LDLCALC, TRIG, CHOLHDL, LDLDIRECT in the last 72 hours. Thyroid Function Tests: No results for input(s): TSH, T4TOTAL, FREET4, T3FREE, THYROIDAB in the last 72 hours. Anemia Panel: No results for input(s): VITAMINB12, FOLATE, FERRITIN, TIBC, IRON, RETICCTPCT in the last 72 hours. Sepsis Labs: No results for input(s): PROCALCITON, LATICACIDVEN in the last 168 hours.  No results found for this or any previous visit (from the past 240 hour(s)).       Radiology Studies: Ct Maxillofacial W Contrast  Result Date:  03/18/2016 CLINICAL DATA:  55 year old female with chipped tooth several months ago. Right-sided facial swelling since last night. Initial encounter. EXAM: CT MAXILLOFACIAL WITH CONTRAST TECHNIQUE: Multidetector CT imaging of the maxillofacial structures was performed with intravenous contrast. Multiplanar CT image reconstructions were also generated. A small metallic BB was placed on the right temple in order to reliably differentiate right from left. CONTRAST:  Seventy-five cc ISOVUE-300 IOPAMIDOL (ISOVUE-300) INJECTION 61% COMPARISON:  06/27/2014 head CT and brain MR. FINDINGS: Osseous: Significant dental disease. Caries and periapical lucencies. Most notable periapical lucency upper right incisor/ premolar with cortical interruption upper right premolar level responsible for the patient's 2.7 x 2 x 0.8 cm abscess along the outer margin of the right alveolar ridge and overlying cellulitis. Cellulitis extends throughout the right cheek to just below the orbit along the right nose partially crossing midline. No septic thrombophlebitis of adjacent vein. Marked enlargement right styloid process and moderate prominence left styloid process. This may be an incidental finding although described with dysphagia (Eagle syndrome). C3-4 anterior osteophyte with impression upon the posterior pharynx with narrowing of the air column posterior to the epiglottis. Orbits: No intraorbital extension of inflammatory process. The cellulitis abuts the undersurface of the right orbit/lacrimal duct. Sinuses: Mild mucosal thickening maxillary sinuses greater on the right. Soft tissues: As above. Limited intracranial: Negative. Mild plaque carotid bifurcation bilaterally. IMPRESSION: Significant dental disease. Caries and periapical lucencies. Most notable periapical lucency upper right incisor/ premolar with cortical interruption upper right premolar level responsible for the patient's 2.7 x 2 x 0.8 cm abscess along the outer margin of  the right alveolar ridge and overlying cellulitis. Cellulitis extends throughout the right cheek to just below the orbit along the right nose partially crossing midline. Marked enlargement right styloid process and moderate prominence left styloid process. This may be an incidental finding although described with dysphagia (Eagle syndrome). C3-4 anterior osteophyte with impression upon the posterior pharynx with narrowing of the air column posterior to the epiglottis. Electronically Signed   By: Lacy DuverneySteven  Olson M.D.   On: 03/18/2016 19:28        Scheduled Meds: . enoxaparin (LOVENOX) injection  40 mg Subcutaneous Q24H  . piperacillin-tazobactam (ZOSYN)  IV  3.375 g Intravenous Q8H  . sodium chloride flush  3 mL Intravenous Q12H  . vancomycin  1,000 mg Intravenous Q12H   Continuous Infusions:    LOS: 0 days    Time spent: > 35 minutes  Penny PiaVEGA, Greyden Besecker, MD [facility] Pager [phone]  If 7PM-7AM, please contact night-coverage [email or web address] Password [email or web address] 03/19/2016, 4:25 PM

## 2016-03-20 ENCOUNTER — Inpatient Hospital Stay (HOSPITAL_COMMUNITY): Payer: BLUE CROSS/BLUE SHIELD

## 2016-03-20 DIAGNOSIS — R011 Cardiac murmur, unspecified: Secondary | ICD-10-CM

## 2016-03-20 LAB — ECHOCARDIOGRAM COMPLETE
CHL CUP RV SYS PRESS: 30 mmHg
CHL CUP TV REG PEAK VELOCITY: 261 cm/s
FS: 29 % (ref 28–44)
Height: 70 in
IVS/LV PW RATIO, ED: 1.25
LA diam end sys: 33 mm
LA diam index: 1.8 cm/m2
LASIZE: 33 mm
LAVOL: 46.8 mL
LAVOLA4C: 42.2 mL
LAVOLIN: 25.6 mL/m2
LDCA: 2.84 cm2
LV PW d: 7.59 mm — AB (ref 0.6–1.1)
LV TDI E'MEDIAL: 8.05
LV e' LATERAL: 10.4 cm/s
LVOT diameter: 19 mm
Lateral S' vel: 14.4 cm/s
P 1/2 time: 483 ms
RV TAPSE: 25.2 mm
TDI e' lateral: 10.4
TRMAXVEL: 261 cm/s
Weight: 2335.11 oz

## 2016-03-20 NOTE — Progress Notes (Signed)
  Echocardiogram 2D Echocardiogram has been performed.  Delcie RochENNINGTON, Zamoria Boss 03/20/2016, 4:02 PM

## 2016-03-20 NOTE — Progress Notes (Signed)
PROGRESS NOTE    Jaime Adams  AOZ:[id number] DOB: 02/17/1961 DOA: 03/18/2016 PCP: Grayce SessionsEDWARDS, MICHELLE P, NP  Brief Narrative:  55 y/o presenting with dental abscess  Assessment & Plan:   Active Problems:   Facial cellulitis/Cellulitis - reviewed reported results of CT scan - Continue antibiotics. No dentist available for consult today. - Will continue IV antibiotics for another day. Consider switching to clindamycin soon   DVT prophylaxis:  Lovenox Code Status: Full Family Communication: d/c patient directly Disposition Plan: Pending improvement in condition.  Consultants:   None   Procedures: None   Antimicrobials: Zosyn and Vancomycin    Subjective: Pt states that she feels better today  Objective: Vitals:   03/19/16 1840 03/19/16 2121 03/20/16 0443 03/20/16 1013  BP: 101/66 (!) 151/67 133/81 140/73  Pulse: 68 (!) 59 61 (!) 58  Resp: 18 19 18 18   Temp: 99 F (37.2 C) 98.9 F (37.2 C) 98.6 F (37 C) 98 F (36.7 C)  TempSrc: Oral Oral Oral Oral  SpO2: 99% 100% 98% 100%  Weight:  66.2 kg (145 lb 15.1 oz)    Height:        Intake/Output Summary (Last 24 hours) at 03/20/16 1506 Last data filed at 03/20/16 0600  Gross per 24 hour  Intake              850 ml  Output             1950 ml  Net            -1100 ml   Filed Weights   03/18/16 1610 03/18/16 2313 03/19/16 2121  Weight: 68 kg (150 lb) 66.4 kg (146 lb 6.2 oz) 66.2 kg (145 lb 15.1 oz)    Examination:  General exam: Appears calm and comfortable, in nad. Respiratory system: Clear to auscultation. Respiratory effort normal. Cardiovascular system: S1 & S2 heard, RRR. No JVD, murmurs, rubs, gallops or clicks. No pedal edema. Gastrointestinal system: Abdomen is nondistended, soft and nontender. No organomegaly or masses felt. Normal bowel sounds heard. Central nervous system: Alert and oriented. No focal neurological deficits. Extremities: Symmetric 5 x 5 power. Skin: right cheek swollen  when compared to the left, Erythema and calor present at right side of face Psychiatry: Judgement and insight appear normal. Mood & affect appropriate.   Data Reviewed: I have personally reviewed following labs and imaging studies  CBC:  Recent Labs Lab 03/18/16 1725 03/19/16 0515  WBC 11.1* 12.3*  NEUTROABS 8.8*  --   HGB 12.7 13.1  HCT 38.2 39.5  MCV 94.1 94.3  PLT 243 201   Basic Metabolic Panel:  Recent Labs Lab 03/18/16 2008 03/19/16 0515  NA 137 138  K 3.8 3.5  CL 105 106  CO2 23 22  GLUCOSE 98 86  BUN 5* 6  CREATININE 0.80 0.78  CALCIUM 9.2 9.3   GFR: Estimated Creatinine Clearance: 82.1 mL/min (by C-G formula based on SCr of 0.78 mg/dL). Liver Function Tests:  Recent Labs Lab 03/19/16 0515  AST 20  ALT 9*  ALKPHOS 69  BILITOT 0.7  PROT 7.5  ALBUMIN 4.1   No results for input(s): LIPASE, AMYLASE in the last 168 hours. No results for input(s): AMMONIA in the last 168 hours. Coagulation Profile: No results for input(s): INR, PROTIME in the last 168 hours. Cardiac Enzymes: No results for input(s): CKTOTAL, CKMB, CKMBINDEX, TROPONINI in the last 168 hours. BNP (last 3 results) No results for input(s): PROBNP in the last 8760  hours. HbA1C: No results for input(s): HGBA1C in the last 72 hours. CBG: No results for input(s): GLUCAP in the last 168 hours. Lipid Profile: No results for input(s): CHOL, HDL, LDLCALC, TRIG, CHOLHDL, LDLDIRECT in the last 72 hours. Thyroid Function Tests: No results for input(s): TSH, T4TOTAL, FREET4, T3FREE, THYROIDAB in the last 72 hours. Anemia Panel: No results for input(s): VITAMINB12, FOLATE, FERRITIN, TIBC, IRON, RETICCTPCT in the last 72 hours. Sepsis Labs: No results for input(s): PROCALCITON, LATICACIDVEN in the last 168 hours.  No results found for this or any previous visit (from the past 240 hour(s)).       Radiology Studies: Ct Maxillofacial W Contrast  Result Date: 03/18/2016 CLINICAL DATA:   55 year old female with chipped tooth several months ago. Right-sided facial swelling since last night. Initial encounter. EXAM: CT MAXILLOFACIAL WITH CONTRAST TECHNIQUE: Multidetector CT imaging of the maxillofacial structures was performed with intravenous contrast. Multiplanar CT image reconstructions were also generated. A small metallic BB was placed on the right temple in order to reliably differentiate right from left. CONTRAST:  Seventy-five cc ISOVUE-300 IOPAMIDOL (ISOVUE-300) INJECTION 61% COMPARISON:  06/27/2014 head CT and brain MR. FINDINGS: Osseous: Significant dental disease. Caries and periapical lucencies. Most notable periapical lucency upper right incisor/ premolar with cortical interruption upper right premolar level responsible for the patient's 2.7 x 2 x 0.8 cm abscess along the outer margin of the right alveolar ridge and overlying cellulitis. Cellulitis extends throughout the right cheek to just below the orbit along the right nose partially crossing midline. No septic thrombophlebitis of adjacent vein. Marked enlargement right styloid process and moderate prominence left styloid process. This may be an incidental finding although described with dysphagia (Eagle syndrome). C3-4 anterior osteophyte with impression upon the posterior pharynx with narrowing of the air column posterior to the epiglottis. Orbits: No intraorbital extension of inflammatory process. The cellulitis abuts the undersurface of the right orbit/lacrimal duct. Sinuses: Mild mucosal thickening maxillary sinuses greater on the right. Soft tissues: As above. Limited intracranial: Negative. Mild plaque carotid bifurcation bilaterally. IMPRESSION: Significant dental disease. Caries and periapical lucencies. Most notable periapical lucency upper right incisor/ premolar with cortical interruption upper right premolar level responsible for the patient's 2.7 x 2 x 0.8 cm abscess along the outer margin of the right alveolar ridge and  overlying cellulitis. Cellulitis extends throughout the right cheek to just below the orbit along the right nose partially crossing midline. Marked enlargement right styloid process and moderate prominence left styloid process. This may be an incidental finding although described with dysphagia (Eagle syndrome). C3-4 anterior osteophyte with impression upon the posterior pharynx with narrowing of the air column posterior to the epiglottis. Electronically Signed   By: Lacy Duverney M.D.   On: 03/18/2016 19:28    Scheduled Meds: . enoxaparin (LOVENOX) injection  40 mg Subcutaneous Q24H  . piperacillin-tazobactam (ZOSYN)  IV  3.375 g Intravenous Q8H  . sodium chloride flush  3 mL Intravenous Q12H  . vancomycin  1,000 mg Intravenous Q12H   Continuous Infusions:    LOS: 1 day   Time spent: > 35 minutes  Penny Pia, MD [facility] Pager [phone]  If 7PM-7AM, please contact night-coverage [email or web address] Password [email or web address] 03/20/2016, 3:06 PM

## 2016-03-21 MED ORDER — CLINDAMYCIN HCL 300 MG PO CAPS: 300.0000 mg | ORAL_CAPSULE | Freq: Three times a day (TID) | ORAL | 0 refills | Status: AC

## 2016-03-21 MED ORDER — IBUPROFEN 400 MG PO TABS: 400.0000 mg | ORAL_TABLET | Freq: Four times a day (QID) | ORAL | 0 refills | Status: DC | PRN

## 2016-03-21 MED ORDER — CLINDAMYCIN HCL 300 MG PO CAPS
300.0000 mg | ORAL_CAPSULE | Freq: Three times a day (TID) | ORAL | Status: DC
  Administered 2016-03-21: 300 mg via ORAL
  Filled 2016-03-21: qty 1

## 2016-03-21 NOTE — Discharge Summary (Signed)
Physician Discharge Summary  Jaime Adams Ebrahim WGN:[id number] DOB: 09/21/1960 DOA: 03/18/2016  PCP: Grayce SessionsEDWARDS, MICHELLE P, NP  Admit date: 03/18/2016 Discharge date: 03/21/2016  Time spent: > 35 minutes  Recommendations for Outpatient Follow-up:  1. Please review CT maxillofacial w contrast 2. Reassess WBC levels 3. Ensure patient f/u with dentist    Discharge Diagnoses:  Active Problems:   Facial cellulitis   Cellulitis   Discharge Condition: stable  Diet recommendation: heart healthy diet  Filed Weights   03/18/16 2313 03/19/16 2121 03/20/16 2059  Weight: 66.4 kg (146 lb 6.2 oz) 66.2 kg (145 lb 15.1 oz) 69.3 kg (152 lb 11.2 oz)    History of present illness:  55 y/o presenting with dental abscess  Hospital Course:  Active Problems:   Facial cellulitis/Cellulitis - No dentist available for consult in house. Will recommend pt f/u with dentist as outpatient. - Pt has had 3 days of antibiotics approximately, Transitioned to clindamycin. Will d/c on 5 more days of clindamycin  Procedures: None, please see below  Consultations:  none  Discharge Exam: Vitals:   03/21/16 0453 03/21/16 0900  BP: (!) 157/78 (!) 144/73  Pulse: (!) 57 61  Resp: 18 18  Temp: 98.1 F (36.7 C) 98.3 F (36.8 C)    General: Pt in nad, alert and awake Cardiovascular: rrr, no rubs Respiratory: no increased wob, no wheezes  Discharge Instructions   Discharge Instructions    Call MD for:  severe uncontrolled pain    Complete by:  As directed    Call MD for:  temperature >100.4    Complete by:  As directed    Diet - low sodium heart healthy    Complete by:  As directed    Discharge instructions    Complete by:  As directed    Please be sure to follow up with a dentist within the next 1-3 days. I would recommend Dr. Donzetta [facility] [address] of [address]. Please call their office to set up appointment.   Increase activity slowly    Complete by:  As directed      Current Discharge  Medication List    START taking these medications   Details  clindamycin (CLEOCIN) 300 MG capsule Take 1 capsule (300 mg total) by mouth every 8 (eight) hours. Qty: 15 capsule, Refills: 0    ibuprofen (ADVIL,MOTRIN) 400 MG tablet Take 1 tablet (400 mg total) by mouth every 6 (six) hours as needed for fever, mild pain or moderate pain. Qty: 30 tablet, Refills: 0       No Known Allergies    The results of significant diagnostics from this hospitalization (including imaging, microbiology, ancillary and laboratory) are listed below for reference.    Significant Diagnostic Studies: Ct Maxillofacial W Contrast  Result Date: 03/18/2016 CLINICAL DATA:  [age] year old female with chipped tooth several months ago. Right-sided facial swelling since last night. Initial encounter. EXAM: CT MAXILLOFACIAL WITH CONTRAST TECHNIQUE: Multidetector CT imaging of the maxillofacial structures was performed with intravenous contrast. Multiplanar CT image reconstructions were also generated. A small metallic BB was placed on the right temple in order to reliably differentiate right from left. CONTRAST:  Seventy-five cc ISOVUE-300 IOPAMIDOL (ISOVUE-300) INJECTION 61% COMPARISON:  06/27/2014 head CT and brain MR. FINDINGS: Osseous: Significant dental disease. Caries and periapical lucencies. Most notable periapical lucency upper right incisor/ premolar with cortical interruption upper right premolar level responsible for the patient's 2.7 x 2 x 0.8 cm abscess along the outer margin of the right alveolar ridge  and overlying cellulitis. Cellulitis extends throughout the right cheek to just below the orbit along the right nose partially crossing midline. No septic thrombophlebitis of adjacent vein. Marked enlargement right styloid process and moderate prominence left styloid process. This may be an incidental finding although described with dysphagia (Eagle syndrome). C3-4 anterior osteophyte with impression upon the posterior  pharynx with narrowing of the air column posterior to the epiglottis. Orbits: No intraorbital extension of inflammatory process. The cellulitis abuts the undersurface of the right orbit/lacrimal duct. Sinuses: Mild mucosal thickening maxillary sinuses greater on the right. Soft tissues: As above. Limited intracranial: Negative. Mild plaque carotid bifurcation bilaterally. IMPRESSION: Significant dental disease. Caries and periapical lucencies. Most notable periapical lucency upper right incisor/ premolar with cortical interruption upper right premolar level responsible for the patient's 2.7 x 2 x 0.8 cm abscess along the outer margin of the right alveolar ridge and overlying cellulitis. Cellulitis extends throughout the right cheek to just below the orbit along the right nose partially crossing midline. Marked enlargement right styloid process and moderate prominence left styloid process. This may be an incidental finding although described with dysphagia (Eagle syndrome). C3-4 anterior osteophyte with impression upon the posterior pharynx with narrowing of the air column posterior to the epiglottis. Electronically Signed   By: Lacy Duverney M.D.   On: 03/18/2016 19:28    Microbiology: Recent Results (from the past 240 hour(s))  Culture, blood (routine x 2)     Status: None (Preliminary result)   Collection Time: 03/18/16 11:20 PM  Result Value Ref Range Status   Specimen Description BLOOD RIGHT ARM  Final   Special Requests AEROBIC BOTTLE ONLY  Final   Culture NO GROWTH 1 DAY  Final   Report Status PENDING  Incomplete  Culture, blood (routine x 2)     Status: None (Preliminary result)   Collection Time: 03/18/16 11:29 PM  Result Value Ref Range Status   Specimen Description BLOOD LEFT ARM  Final   Special Requests AEROBIC BOTTLE ONLY  Final   Culture NO GROWTH 1 DAY  Final   Report Status PENDING  Incomplete     Labs: Basic Metabolic Panel:  Recent Labs Lab 03/18/16 2008  03/19/16 0515  NA 137 138  K 3.8 3.5  CL 105 106  CO2 23 22  GLUCOSE 98 86  BUN 5* 6  CREATININE 0.80 0.78  CALCIUM 9.2 9.3   Liver Function Tests:  Recent Labs Lab 03/19/16 0515  AST 20  ALT 9*  ALKPHOS 69  BILITOT 0.7  PROT 7.5  ALBUMIN 4.1   No results for input(s): LIPASE, AMYLASE in the last 168 hours. No results for input(s): AMMONIA in the last 168 hours. CBC:  Recent Labs Lab 03/18/16 1725 03/19/16 0515  WBC 11.1* 12.3*  NEUTROABS 8.8*  --   HGB 12.7 13.1  HCT 38.2 39.5  MCV 94.1 94.3  PLT 243 201   Cardiac Enzymes: No results for input(s): CKTOTAL, CKMB, CKMBINDEX, TROPONINI in the last 168 hours. BNP: BNP (last 3 results) No results for input(s): BNP in the last 8760 hours.  ProBNP (last 3 results) No results for input(s): PROBNP in the last 8760 hours.  CBG: No results for input(s): GLUCAP in the last 168 hours.  Signed:  Penny Pia MD.  [facility] 03/21/2016, 2:21 PM

## 2016-03-24 LAB — CULTURE, BLOOD (ROUTINE X 2)
CULTURE: NO GROWTH
Culture: NO GROWTH

## 2016-05-23 ENCOUNTER — Emergency Department (HOSPITAL_COMMUNITY)
Admission: EM | Admit: 2016-05-23 | Discharge: 2016-05-23 | Disposition: A | Discharge status: Home / Self Care | Payer: BLUE CROSS/BLUE SHIELD | Attending: Emergency Medicine | Admitting: Emergency Medicine

## 2016-05-23 ENCOUNTER — Encounter (HOSPITAL_COMMUNITY): Payer: Self-pay | Admitting: Emergency Medicine

## 2016-05-23 DIAGNOSIS — F17200 Nicotine dependence, unspecified, uncomplicated: Secondary | ICD-10-CM | POA: Insufficient documentation

## 2016-05-23 DIAGNOSIS — I10 Essential (primary) hypertension: Secondary | ICD-10-CM | POA: Insufficient documentation

## 2016-05-23 DIAGNOSIS — K047 Periapical abscess without sinus: Secondary | ICD-10-CM | POA: Insufficient documentation

## 2016-05-23 MED ORDER — CLINDAMYCIN HCL 150 MG PO CAPS: 300.0000 mg | ORAL_CAPSULE | Freq: Three times a day (TID) | ORAL | 0 refills | Status: DC

## 2016-05-23 MED ORDER — IBUPROFEN 400 MG PO TABS: 400.0000 mg | ORAL_TABLET | Freq: Four times a day (QID) | ORAL | 0 refills | Status: DC | PRN

## 2016-05-23 MED ORDER — IBUPROFEN 400 MG PO TABS
600.0000 mg | ORAL_TABLET | Freq: Once | ORAL | Status: AC
  Administered 2016-05-23: 600 mg via ORAL
  Filled 2016-05-23: qty 1

## 2016-05-23 NOTE — ED Notes (Signed)
EDP and PA at bedside.  

## 2016-05-23 NOTE — ED Provider Notes (Signed)
[facility]-EMERGENCY DEPT Provider Note   CSN: [id number] Arrival date & time: 05/23/16  2128   By signing my name below, I, Jaime Adams, attest that this documentation has been prepared under the direction and in the presence of Jaime Adams, Jaime Adams. Electronically Signed: Clarisse GougeXavier Adams, Scribe. 05/24/16. 12:16 AM.   History   Chief Complaint Chief Complaint  Patient presents with  . Dental Pain   The history is provided by the patient. No language interpreter was used.    HPI Comments: Jaime Adams is a 55 y.o. female who was admitted on 03/18/16 for dental abscess and facial cellulitis and presents to the Emergency Department complaining of constant, gradually worsening, 10/10 right upper dental pain x 1 day. She believes her pain is secondary to a dental abscess flare up, and she states that her pain came on last night while she was eating pasta salad. She reports associated chills, right jaw and cheek pain exacerbated when opening her mouth, and facial swelling. Pt does not currently have a regular dentist due to insufficient finances. She denies sore throat, neck, abdomen and chest pain, urinary symptoms, nausea, vomiting and neck pain. NKDA.   Past Medical History:  Diagnosis Date  . High cholesterol   . Hypertension     Patient Active Problem List   Diagnosis Date Noted  . Facial cellulitis 03/18/2016  . Cellulitis 03/18/2016    History reviewed. No pertinent surgical history.  OB History    No data available       Home Medications    Prior to Admission medications   Medication Sig Start Date End Date Taking? Authorizing Provider  clindamycin (CLEOCIN) 150 MG capsule Take 2 capsules (300 mg total) by mouth 3 (three) times daily. 05/23/16   Jaime Adams, Jaime Adams  ibuprofen (ADVIL,MOTRIN) 400 MG tablet Take 1 tablet (400 mg total) by mouth every 6 (six) hours as needed for fever, mild pain or moderate pain. 05/23/16   Jaime Adams, Jaime Adams    Family  History Family History  Problem Relation Age of Onset  . Hypertension Mother   . Alzheimer's disease Mother   . Cirrhosis Father   . Cancer Maternal Aunt   . Heart attack Brother   . Stroke Brother   . Aneurysm Brother   . Diabetes Brother     Social History Social History  Substance Use Topics  . Smoking status: Current Every Day Smoker    Packs/day: 1.00    Years: 20.00  . Smokeless tobacco: Never Used  . Alcohol use 0.0 oz/week     Allergies   Patient has no known allergies.   Review of Systems Review of Systems  Constitutional: Positive for fever (tactile). Negative for chills.  HENT: Positive for dental problem. Negative for facial swelling and sore throat.   Respiratory: Negative for shortness of breath.   Cardiovascular: Negative for chest pain.  Gastrointestinal: Negative for abdominal pain, nausea and vomiting.  Genitourinary: Negative for dysuria.  Musculoskeletal: Negative for back pain.  Skin: Negative for rash and wound.  Neurological: Negative for headaches.  Psychiatric/Behavioral: The patient is not nervous/anxious.      Physical Exam Updated Vital Signs BP 145/92 (BP Location: Left Arm)   Pulse 80   Temp 98.7 F (37.1 C) (Oral)   Resp 20   SpO2 97%   Physical Exam  Constitutional: She appears well-developed and well-nourished. No distress.  HENT:  Head: Normocephalic and atraumatic.  Mouth/Throat: Oropharynx is clear and moist. No oropharyngeal  exudate, posterior oropharyngeal edema, posterior oropharyngeal erythema or tonsillar abscesses.  Edema, tenderness to gingiva over teeth. Poor; no significant fluctuance; tenderness extending to right maxillary bone  Eyes: Conjunctivae are normal. Pupils are equal, round, and reactive to light. Right eye exhibits no discharge. Left eye exhibits no discharge. No scleral icterus.  Neck: Normal range of motion. Neck supple. No thyromegaly present.  Cardiovascular: Normal rate, regular rhythm, normal  heart sounds and intact distal pulses.  Exam reveals no gallop and no friction rub.   No murmur heard. Pulmonary/Chest: Effort normal and breath sounds normal. No stridor. No respiratory distress. She has no wheezes. She has no rales.  Abdominal: Soft. Bowel sounds are normal. She exhibits no distension. There is no tenderness. There is no rebound and no guarding.  Musculoskeletal: She exhibits no edema.  Lymphadenopathy:    She has no cervical adenopathy.  Neurological: She is alert. Coordination normal.  Skin: Skin is warm and dry. No rash noted. She is not diaphoretic. No pallor.  Psychiatric: She has a normal mood and affect.  Nursing note and vitals reviewed.    ED Treatments / Results  DIAGNOSTIC STUDIES: Oxygen Saturation is 99% on RA, normal by my interpretation.    COORDINATION OF CARE: 12:16 AM Discussed treatment plan with pt at bedside and pt agreed to plan.  Labs (all labs ordered are listed, but only abnormal results are displayed) Labs Reviewed - No data to display  EKG  EKG Interpretation None       Radiology No results found.  Procedures Procedures (including critical care time)  Medications Ordered in ED Medications  ibuprofen (ADVIL,MOTRIN) tablet 600 mg (600 mg Oral Given 05/23/16 2343)     Initial Impression / Assessment and Plan / ED Course  I have reviewed the triage vital signs and the nursing notes.  Pertinent labs & imaging results that were available during my care of the patient were reviewed by me and considered in my medical decision making (see chart for details).  Clinical Course     Patient with dentalgia with possible abscess versus hematoma collection. No indication for I and D at this time. Exam not concerning for Ludwig's angina or pharyngeal abscess.  Will treat with clindamycin. Pt instructed to follow-up with dentist by calling Dr. Norris Crossurner's office tomorrow.  Discussed that the patient should return in 2 days if she is unable to  see a dentist by then. Patient understands and agrees with plan. Patient vitals stable throughout ED course and discharged in satisfactory condition. Patient also evaluated by Dr. Patria Maneampos who guided the patient management and agrees with plan.  Final Clinical Impressions(s) / ED Diagnoses   Final diagnoses:  Dental abscess  I personally performed the services described in this documentation, which was scribed in my presence. The recorded information has been reviewed and is accurate.   New Prescriptions Discharge Medication List as of 05/23/2016 11:12 PM    START taking these medications   Details  clindamycin (CLEOCIN) 150 MG capsule Take 2 capsules (300 mg total) by mouth 3 (three) times daily., Starting Sun 05/23/2016, Print         [address]Raney Antwine M Anan Dapolito, Jaime Adams 05/24/16 [id number]    Azalia BilisKevin Campos, MD 05/24/16 [phone]

## 2016-05-23 NOTE — ED Triage Notes (Signed)
Pt reports R upper dental pain and mouth swelling x 24 hours.

## 2016-05-23 NOTE — Discharge Instructions (Signed)
Medications: Clindamycin, ibuprofen  Treatment: Take clindamycin as prescribed for 7 days. Take ibuprofen every 6 hours as needed for pain and fever.  Follow-up: It is important that you call Dr. Norris Crossurner's office first thing tomorrow morning to be seen for your dental pain and abscess. Please return to emergency department in 2 days if you are unable to be seen by Dr. Mayford Knifeurner.

## 2016-05-23 NOTE — ED Notes (Addendum)
Pt was seen and treated in October for abscess. Clindamycin 330mg  prescribed and completed.Has not followed up with Dentist due to financial reasons.

## 2017-02-25 ENCOUNTER — Emergency Department (HOSPITAL_COMMUNITY)
Admission: EM | Admit: 2017-02-25 | Discharge: 2017-02-25 | Disposition: A | Discharge status: Home / Self Care | Payer: Self-pay | Attending: Emergency Medicine | Admitting: Emergency Medicine

## 2017-02-25 ENCOUNTER — Emergency Department (HOSPITAL_COMMUNITY): Payer: Self-pay

## 2017-02-25 ENCOUNTER — Encounter (HOSPITAL_COMMUNITY): Payer: Self-pay | Admitting: *Deleted

## 2017-02-25 DIAGNOSIS — I10 Essential (primary) hypertension: Secondary | ICD-10-CM | POA: Insufficient documentation

## 2017-02-25 DIAGNOSIS — R6884 Jaw pain: Secondary | ICD-10-CM | POA: Insufficient documentation

## 2017-02-25 DIAGNOSIS — J029 Acute pharyngitis, unspecified: Secondary | ICD-10-CM | POA: Insufficient documentation

## 2017-02-25 DIAGNOSIS — F17210 Nicotine dependence, cigarettes, uncomplicated: Secondary | ICD-10-CM | POA: Insufficient documentation

## 2017-02-25 LAB — I-STAT CHEM 8, ED
BUN: 13 mg/dL (ref 6–20)
CHLORIDE: 105 mmol/L (ref 101–111)
Calcium, Ion: 1.15 mmol/L (ref 1.15–1.40)
Creatinine, Ser: 0.6 mg/dL (ref 0.44–1.00)
Glucose, Bld: 90 mg/dL (ref 65–99)
HCT: 40 % (ref 36.0–46.0)
Hemoglobin: 13.6 g/dL (ref 12.0–15.0)
POTASSIUM: 3.7 mmol/L (ref 3.5–5.1)
SODIUM: 140 mmol/L (ref 135–145)
TCO2: 23 mmol/L (ref 22–32)

## 2017-02-25 MED ORDER — HYDROMORPHONE HCL 1 MG/ML IJ SOLN
1.0000 mg | Freq: Once | INTRAMUSCULAR | Status: AC
  Administered 2017-02-25: 1 mg via INTRAMUSCULAR
  Filled 2017-02-25: qty 1

## 2017-02-25 MED ORDER — NAPROXEN 500 MG PO TABS: 500.0000 mg | ORAL_TABLET | Freq: Two times a day (BID) | ORAL | 0 refills | Status: DC

## 2017-02-25 NOTE — ED Provider Notes (Addendum)
[facility] Provider Note   CSN: [id number] Arrival date & time: 02/25/17  [id number]     History   Chief Complaint Chief Complaint  Patient presents with  . Otalgia  . Sore Throat    HPI Jaime Adams is a 56 y.o. female.  HPI  56 year old female presents with right jaw and ear pain for the last 2 weeks. She states she saw her PCP who put her on a sinus medicine that she does not know the name of. She feels like the pain is been doing okay until this morning when he seems to be all of a sudden worse. She has trouble swallowing because it hurts. No nausea or vomiting. Mild headache starting yesterday but no significant headache currently. No chest pain or shortness of breath. No dental pain. No trouble swallowing water or saliva. Has not taken anything for the pain this morning. She was noted to be quite hypertensive in triage and states that she used to have hypertension bolus take off meds 10 years ago and has not been on since. No neck swelling. Some worsening of pain with ROM to the right.  Past Medical History:  Diagnosis Date  . High cholesterol   . Hypertension     Patient Active Problem List   Diagnosis Date Noted  . Facial cellulitis 03/18/2016  . Cellulitis 03/18/2016    History reviewed. No pertinent surgical history.  OB History    No data available       Home Medications    Prior to Admission medications   Medication Sig Start Date End Date Taking? Authorizing Provider  clindamycin (CLEOCIN) 150 MG capsule Take 2 capsules (300 mg total) by mouth 3 (three) times daily. 05/23/16   Law, Waylan BogaAlexandra M, PA-C  ibuprofen (ADVIL,MOTRIN) 400 MG tablet Take 1 tablet (400 mg total) by mouth every 6 (six) hours as needed for fever, mild pain or moderate pain. 05/23/16   Law, Waylan BogaAlexandra M, PA-C  naproxen (NAPROSYN) 500 MG tablet Take 1 tablet (500 mg total) by mouth 2 (two) times daily. 02/25/17   Pricilla LovelessGoldston, Lory Galan, MD    Family History Family History  Problem  Relation Age of Onset  . Hypertension Mother   . Alzheimer's disease Mother   . Cirrhosis Father   . Cancer Maternal Aunt   . Heart attack Brother   . Stroke Brother   . Aneurysm Brother   . Diabetes Brother     Social History Social History  Substance Use Topics  . Smoking status: Current Every Day Smoker    Packs/day: 1.00    Years: 20.00  . Smokeless tobacco: Never Used  . Alcohol use 0.0 oz/week     Allergies   Patient has no known allergies.   Review of Systems Review of Systems  Constitutional: Negative for fever.  HENT: Positive for ear pain and trouble swallowing. Negative for congestion and rhinorrhea.   Respiratory: Negative for shortness of breath.   Cardiovascular: Negative for chest pain.  Gastrointestinal: Negative for vomiting.  Musculoskeletal: Positive for neck pain.  Neurological: Negative for weakness and numbness.  All other systems reviewed and are negative.    Physical Exam Updated Vital Signs BP (!) 161/98   Pulse 63   Temp 98.1 F (36.7 C) (Oral)   Resp 13   SpO2 100%   Physical Exam  Constitutional: She is oriented to person, place, and time. She appears well-developed and well-nourished. No distress.  HENT:  Head: Normocephalic and atraumatic.  Right Ear: Tympanic membrane, external ear and ear canal normal.  Left Ear: Tympanic membrane, external ear and ear canal normal.  Nose: Nose normal.  Mouth/Throat: Oropharynx is clear and moist. No oropharyngeal exudate.  No focal dental swelling, pain or gingival abnormality  Eyes: Pupils are equal, round, and reactive to light. EOM are normal. Right eye exhibits no discharge. Left eye exhibits no discharge.  Neck: Normal range of motion. Neck supple. Muscular tenderness present.  Cardiovascular: Normal rate, regular rhythm and normal heart sounds.   Pulmonary/Chest: Effort normal and breath sounds normal.  Abdominal: Soft. There is no tenderness.  Neurological: She is alert and  oriented to person, place, and time.  Skin: Skin is warm and dry. She is not diaphoretic.  Nursing note and vitals reviewed.    ED Treatments / Results  Labs (all labs ordered are listed, but only abnormal results are displayed) Labs Reviewed  I-STAT CHEM 8, ED    EKG  EKG Interpretation  Date/Time:  Friday February 25 2017 10:35:06 EDT Ventricular Rate:  62 PR Interval:    QRS Duration: 90 QT Interval:  454 QTC Calculation: 462 R Axis:   65 Text Interpretation:  Sinus rhythm no acute ST/T changes Confirmed by Pricilla Loveless [id number]) on 02/25/2017 10:40:18 AM       Radiology Dg Leone Brand  Result Date: 02/25/2017 CLINICAL DATA:  Right mandible/ear pain x 3 weeks ago, severe x this am, hurts to swallow or chew, smoker- 1 ppd x 5 months, prior was 1/2 ppd x age 12 EXAM: ORTHOPANTOGRAM/PANORAMIC COMPARISON:  None. FINDINGS: No acute findings within the right mandible or right maxilla. No dental caries seen on the right. No evidence of periapical abscess. Large lucency is seen within the left maxillary first molar, suspected large cavity. Smaller lucency within the adjacent maxillary bicuspid is also suggestive of a cavity. IMPRESSION: No findings within the right mandible or right maxilla to correspond to the history of right mandibular pain. Probable cavities within the left maxillary first molar and adjacent bicuspid. Electronically Signed   By: Bary Richard M.D.   On: 02/25/2017 10:37    Procedures Procedures (including critical care time)  Medications Ordered in ED Medications  HYDROmorphone (DILAUDID) injection 1 mg (1 mg Intramuscular Given 02/25/17 1005)     Initial Impression / Assessment and Plan / ED Course  I have reviewed the triage vital signs and the nursing notes.  Pertinent labs & imaging results that were available during my care of the patient were reviewed by me and considered in my medical decision making (see chart for details).     No drooling or  obvious intraoral cause of her symptoms. No decreased range of motion of her neck or focal neck swelling. She is tolerating oral secretions without difficulty. It seems that it induces pain over the angle of her jaw whenever she swallows food or eats. Not quite consistent with TMJ. X-ray without acute pathology that would cause her symptoms. I do not think a CT would be beneficial given no focal findings otherwise are swelling. I think she would benefit from ENT consultation. She is also noted to be hypertensive, she states this is not typical for her. It might be related to pain. I discussions a follow closely with her PCP for a recheck and possibly being put on meds. Discussed using NSAIDs as this will probably benefit her the most. Discussed return precautions.  Final Clinical Impressions(s) / ED Diagnoses   Final diagnoses:  Jaw pain  Essential hypertension    New Prescriptions Discharge Medication List as of 02/25/2017 10:54 AM    START taking these medications   Details  naproxen (NAPROSYN) 500 MG tablet Take 1 tablet (500 mg total) by mouth 2 (two) times daily., Starting Fri 02/25/2017, Print         Pricilla Loveless, MD 02/25/17 1610    Pricilla Loveless, MD 02/25/17 [phone]

## 2017-02-25 NOTE — ED Triage Notes (Signed)
Pt reports onset over a week ago of right ear pain and right throat pain. Went to pcp and given allergy meds with no relief. reports now has severe pain when swallowing. Airway intact.

## 2017-02-25 NOTE — ED Notes (Signed)
Pt ambulated to restroom with steady gait Tolerated well 

## 2018-04-01 ENCOUNTER — Emergency Department (HOSPITAL_COMMUNITY)
Admission: EM | Admit: 2018-04-01 | Discharge: 2018-04-01 | Disposition: A | Discharge status: Home / Self Care | Payer: Self-pay | Attending: Emergency Medicine | Admitting: Emergency Medicine

## 2018-04-01 ENCOUNTER — Encounter (HOSPITAL_COMMUNITY): Payer: Self-pay | Admitting: Emergency Medicine

## 2018-04-01 DIAGNOSIS — I10 Essential (primary) hypertension: Secondary | ICD-10-CM | POA: Insufficient documentation

## 2018-04-01 DIAGNOSIS — K0889 Other specified disorders of teeth and supporting structures: Secondary | ICD-10-CM | POA: Insufficient documentation

## 2018-04-01 DIAGNOSIS — F17200 Nicotine dependence, unspecified, uncomplicated: Secondary | ICD-10-CM | POA: Insufficient documentation

## 2018-04-01 DIAGNOSIS — Z79899 Other long term (current) drug therapy: Secondary | ICD-10-CM | POA: Insufficient documentation

## 2018-04-01 MED ORDER — IBUPROFEN 600 MG PO TABS: 600.0000 mg | ORAL_TABLET | Freq: Four times a day (QID) | ORAL | 0 refills | Status: DC | PRN

## 2018-04-01 MED ORDER — PENICILLIN V POTASSIUM 500 MG PO TABS: 500.0000 mg | ORAL_TABLET | Freq: Four times a day (QID) | ORAL | 0 refills | Status: AC

## 2018-04-01 MED ORDER — ACETAMINOPHEN 500 MG PO TABS: 500.0000 mg | ORAL_TABLET | Freq: Four times a day (QID) | ORAL | 0 refills | Status: DC | PRN

## 2018-04-01 NOTE — ED Triage Notes (Signed)
Pt presents to ED for assessment of left upper dental pain x 3 days, hx of broken tooth to same area.

## 2018-04-01 NOTE — ED Provider Notes (Signed)
[facility] EMERGENCY DEPARTMENT Provider Note   CSN: [id number] Arrival date & time: 04/01/18  1840     History   Chief Complaint Chief Complaint  Patient presents with  . Dental Pain    HPI Jaime Adams is a 57 y.o. female with history of high cholesterol and hypertension presents for evaluation of acute onset, progressively worsening left maxillary dental pain for 3 days.  Pain is constant, aching, radiates up to the right side of the face.  She knows that she has a cracked tooth in that area but is waiting for her orange card in order to get dental benefits.  She has tried ibuprofen and Excedrin with some relief.  Denies fevers, facial swelling, difficulty breathing or swallowing, nausea, or vomiting.  The history is provided by the patient.    Past Medical History:  Diagnosis Date  . High cholesterol   . Hypertension     Patient Active Problem List   Diagnosis Date Noted  . Facial cellulitis 03/18/2016  . Cellulitis 03/18/2016    History reviewed. No pertinent surgical history.   OB History   None      Home Medications    Prior to Admission medications   Medication Sig Start Date End Date Taking? Authorizing Provider  acetaminophen (TYLENOL) 500 MG tablet Take 1 tablet (500 mg total) by mouth every 6 (six) hours as needed. 04/01/18   Eunie Lawn A, PA-C  clindamycin (CLEOCIN) 150 MG capsule Take 2 capsules (300 mg total) by mouth 3 (three) times daily. 05/23/16   Law, Waylan Boga, PA-C  ibuprofen (ADVIL,MOTRIN) 600 MG tablet Take 1 tablet (600 mg total) by mouth every 6 (six) hours as needed. 04/01/18   Yoshiharu Brassell A, PA-C  naproxen (NAPROSYN) 500 MG tablet Take 1 tablet (500 mg total) by mouth 2 (two) times daily. 02/25/17   Pricilla Loveless, MD  penicillin v potassium (VEETID) 500 MG tablet Take 1 tablet (500 mg total) by mouth 4 (four) times daily for 7 days. 04/01/18 04/08/18  Jeanie Sewer, PA-C    Family History Family History    Problem Relation Age of Onset  . Hypertension Mother   . Alzheimer's disease Mother   . Cirrhosis Father   . Cancer Maternal Aunt   . Heart attack Brother   . Stroke Brother   . Aneurysm Brother   . Diabetes Brother     Social History Social History   Tobacco Use  . Smoking status: Current Every Day Smoker    Packs/day: 1.00    Years: 20.00    Pack years: 20.00  . Smokeless tobacco: Never Used  Substance Use Topics  . Alcohol use: Yes    Alcohol/week: 0.0 standard drinks  . Drug use: No     Allergies   Patient has no known allergies.   Review of Systems Review of Systems  Constitutional: Negative for fever.  HENT: Positive for dental problem. Negative for sore throat and trouble swallowing.   Respiratory: Negative for shortness of breath.   Gastrointestinal: Negative for abdominal pain, nausea and vomiting.     Physical Exam Updated Vital Signs BP 106/73 (BP Location: Right Arm)   Pulse 76   Temp 98.6 F (37 C) (Oral)   Resp 16   SpO2 97%   Physical Exam  Constitutional: She appears well-developed and well-nourished. No distress.  HENT:  Head: Normocephalic and atraumatic.  Diffusely decaying dentition with cracked and missing teeth.  Right maxillary first molar  is cracked with exposed dentin.  There is tenderness to percussion overlying the maxilla of this area. Dentition appears to be stable. No noted area of swelling or fluctuance. No trismus. Mouth opening to at least 3 finger widths. Handles oral secretions without difficulty. No noted facial swelling. No swelling or tenderness to the submental or submandibular regions. No swelling or tenderness into the soft tissues of the neck.   Eyes: Conjunctivae and EOM are normal. Right eye exhibits no discharge. Left eye exhibits no discharge.  No pain with EOMs  Neck: Normal range of motion. Neck supple. No JVD present. No tracheal deviation present.  Cardiovascular: Normal rate.  Pulmonary/Chest:  Effort normal.  Abdominal: She exhibits no distension.  Musculoskeletal: She exhibits no edema.  Lymphadenopathy:    She has no cervical adenopathy.  Neurological: She is alert.  Skin: Skin is warm and dry. No erythema.  Psychiatric: She has a normal mood and affect. Her behavior is normal.  Nursing note and vitals reviewed.    ED Treatments / Results  Labs (all labs ordered are listed, but only abnormal results are displayed) Labs Reviewed - No data to display  EKG None  Radiology No results found.  Procedures Procedures (including critical care time)  Medications Ordered in ED Medications - No data to display   Initial Impression / Assessment and Plan / ED Course  I have reviewed the triage vital signs and the nursing notes.  Pertinent labs & imaging results that were available during my care of the patient were reviewed by me and considered in my medical decision making (see chart for details).     Patient with toothache.  No gross abscess.  She is afebrile, vital signs are stable.  She is tolerating secretions without difficulty.  Exam unconcerning for Ludwig's angina or spread of infection.  Will treat with penicillin and NSAIDs.  Urged patient to follow-up with dentist.  Discussed strict ED return precautions. Pt verbalized understanding of and agreement with plan and is safe for discharge home at this time.    Final Clinical Impressions(s) / ED Diagnoses   Final diagnoses:  Pain, dental    ED Discharge Orders         Ordered    penicillin v potassium (VEETID) 500 MG tablet  4 times daily     04/01/18 2155    acetaminophen (TYLENOL) 500 MG tablet  Every 6 hours PRN     04/01/18 2155    ibuprofen (ADVIL,MOTRIN) 600 MG tablet  Every 6 hours PRN     04/01/18 2155           Jeanie Sewer, PA-C 04/01/18 2155    Terrilee Files, MD 04/02/18 1020

## 2018-04-01 NOTE — Discharge Instructions (Signed)
Please take all of your antibiotics until finished!   You may develop abdominal discomfort or diarrhea from the antibiotic.  You may help offset this with probiotics which you can buy or get in yogurt. Do not eat  or take the probiotics until 2 hours after your antibiotic.   Apply warm compresses to jaw throughout the day. Alternate 600 mg of ibuprofen and [phone] mg of Tylenol every 3 hours as needed for pain. Do not exceed 4000 mg of Tylenol daily.  You may also use warm water salt gargles, Orajel, or other over-the-counter dental pain remedies. Followup with a dentist is very important for ongoing evaluation and management of recurrent dental pain.  I have attached resources for follow-up with a dentist.  Please look through this packet.  Return to emergency department for emergent changing or worsening symptoms such as fever, worsening facial swelling, difficulty breathing or swallowing, throat tightness, or vision changes.

## 2018-04-18 ENCOUNTER — Other Ambulatory Visit: Payer: Self-pay | Admitting: Family Medicine

## 2018-04-18 DIAGNOSIS — Z1231 Encounter for screening mammogram for malignant neoplasm of breast: Secondary | ICD-10-CM

## 2018-05-31 ENCOUNTER — Ambulatory Visit: Payer: Self-pay

## 2020-04-10 ENCOUNTER — Other Ambulatory Visit: Payer: Self-pay

## 2020-04-10 ENCOUNTER — Encounter (HOSPITAL_COMMUNITY): Payer: Self-pay

## 2020-04-10 ENCOUNTER — Ambulatory Visit (HOSPITAL_COMMUNITY)
Admission: EM | Admit: 2020-04-10 | Discharge: 2020-04-10 | Disposition: A | Discharge status: Home / Self Care | Payer: 59 | Attending: Family Medicine | Admitting: Family Medicine

## 2020-04-10 DIAGNOSIS — Z20822 Contact with and (suspected) exposure to covid-19: Secondary | ICD-10-CM | POA: Diagnosis present

## 2020-04-10 DIAGNOSIS — J069 Acute upper respiratory infection, unspecified: Secondary | ICD-10-CM

## 2020-04-10 LAB — SARS CORONAVIRUS 2 (TAT 6-24 HRS): SARS Coronavirus 2: NEGATIVE

## 2020-04-10 NOTE — ED Provider Notes (Signed)
[facility]    CSN: [id number] Arrival date & time: 04/10/20  1505      History   Chief Complaint Chief Complaint  Patient presents with  . Cough    x 2 days    HPI Jaime Adams is a 59 y.o. female.   HPI   Patient has had Covid vaccinations.  She works for [employer] in [employer].  She is exposed to a lot of students.  She developed cough 2 days ago.  She would like Covid testing.  She states that is required to go back to work.  Patient is also a smoker.  States is not unusual for her to develop coughs in the fall. No fever or chills, no body ache or headache.  No loss of taste or smell.  No specific exposure to Covid although she is exposed to students.  Past Medical History:  Diagnosis Date  . High cholesterol   . Hypertension     Patient Active Problem List   Diagnosis Date Noted  . Facial cellulitis 03/18/2016  . Cellulitis 03/18/2016    History reviewed. No pertinent surgical history.  OB History   No obstetric history on file.      Home Medications    Prior to Admission medications   Medication Sig Start Date End Date Taking? Authorizing Provider  acetaminophen (TYLENOL) 500 MG tablet Take 1 tablet (500 mg total) by mouth every 6 (six) hours as needed. 04/01/18  Yes Fawze, Mina A, PA-C  ibuprofen (ADVIL,MOTRIN) 600 MG tablet Take 1 tablet (600 mg total) by mouth every 6 (six) hours as needed. 04/01/18  Yes Jeanie Sewer, PA-C    Family History Family History  Problem Relation Age of Onset  . Hypertension Mother   . Alzheimer's disease Mother   . Cirrhosis Father   . Cancer Maternal Aunt   . Heart attack Brother   . Stroke Brother   . Aneurysm Brother   . Diabetes Brother     Social History Social History   Tobacco Use  . Smoking status: Current Every Day Smoker    Packs/day: 1.00    Years: 20.00    Pack years: 20.00  . Smokeless tobacco: Never Used  Vaping Use  . Vaping Use: Never used  Substance Use Topics  .  Alcohol use: Yes    Alcohol/week: 0.0 standard drinks  . Drug use: No     Allergies   Patient has no known allergies.   Review of Systems Review of Systems  See HPI  Physical Exam Triage Vital Signs ED Triage Vitals  Enc Vitals Group     BP --      Pulse Rate 04/10/20 1620 85     Resp 04/10/20 1620 17     Temp 04/10/20 1620 98.6 F (37 C)     Temp Source 04/10/20 1620 Oral     SpO2 04/10/20 1620 98 %     Weight --      Height --      Head Circumference --      Peak Flow --      Pain Score 04/10/20 1621 0     Pain Loc --      Pain Edu? --      Excl. in GC? --    No data found.  Updated Vital Signs Pulse 85   Temp 98.6 F (37 C) (Oral)   Resp 17   SpO2 98%  Blood pressure by physician, 122/80  Physical Exam Constitutional:      General: She is not in acute distress.    Appearance: She is well-developed.     Comments: No acute distress  HENT:     Head: Normocephalic and atraumatic.     Nose:     Comments: Mask is in place Eyes:     Conjunctiva/sclera: Conjunctivae normal.     Pupils: Pupils are equal, round, and reactive to light.  Cardiovascular:     Rate and Rhythm: Normal rate and regular rhythm.     Heart sounds: Normal heart sounds.  Pulmonary:     Effort: Pulmonary effort is normal. No respiratory distress.     Breath sounds: Normal breath sounds.     Comments: Lungs are clear Abdominal:     General: There is no distension.     Palpations: Abdomen is soft.  Musculoskeletal:        General: Normal range of motion.     Cervical back: Normal range of motion.  Skin:    General: Skin is warm and dry.  Neurological:     Mental Status: She is alert.  Psychiatric:        Behavior: Behavior normal.      UC Treatments / Results  Labs (all labs ordered are listed, but only abnormal results are displayed) Labs Reviewed  SARS CORONAVIRUS 2 (TAT 6-24 HRS)    EKG   Radiology No results found.  Procedures Procedures (including critical  care time)  Medications Ordered in UC Medications - No data to display  Initial Impression / Assessment and Plan / UC Course  I have reviewed the triage vital signs and the nursing notes.  Pertinent labs & imaging results that were available during my care of the patient were reviewed by me and considered in my medical decision making (see chart for details).      Final Clinical Impressions(s) / UC Diagnoses   Final diagnoses:  Viral URI with cough  Encounter for laboratory testing for COVID-19 virus     Discharge Instructions     Go home to rest Drink plenty of fluids Take Tylenol for pain or fever You may take over-the-counter cough and cold medicines as needed You must quarantine at home until your test result is available You can check for your test result in MyChart    ED Prescriptions    None     PDMP not reviewed this encounter.   Eustace Moore, MD 04/10/20 [phone]

## 2020-04-10 NOTE — ED Triage Notes (Signed)
Pt states she has had a cough x 2 days and would like a covid test. Pt denies any other symptoms and is aox4 and ambulatory.

## 2020-04-10 NOTE — Discharge Instructions (Signed)
Go home to rest Drink plenty of fluids Take Tylenol for pain or fever You may take over-the-counter cough and cold medicines as needed You must quarantine at home until your test result is available You can check for your test result in MyChart  

## 2020-05-12 ENCOUNTER — Encounter (HOSPITAL_COMMUNITY): Payer: Self-pay | Admitting: Urgent Care

## 2020-05-12 ENCOUNTER — Ambulatory Visit (HOSPITAL_COMMUNITY)
Admission: EM | Admit: 2020-05-12 | Discharge: 2020-05-12 | Disposition: A | Discharge status: Home / Self Care | Payer: 59 | Attending: Urgent Care | Admitting: Urgent Care

## 2020-05-12 ENCOUNTER — Other Ambulatory Visit: Payer: Self-pay

## 2020-05-12 ENCOUNTER — Ambulatory Visit (INDEPENDENT_AMBULATORY_CARE_PROVIDER_SITE_OTHER): Payer: 59

## 2020-05-12 DIAGNOSIS — R0602 Shortness of breath: Secondary | ICD-10-CM

## 2020-05-12 DIAGNOSIS — F17200 Nicotine dependence, unspecified, uncomplicated: Secondary | ICD-10-CM | POA: Diagnosis not present

## 2020-05-12 DIAGNOSIS — R079 Chest pain, unspecified: Secondary | ICD-10-CM | POA: Diagnosis not present

## 2020-05-12 DIAGNOSIS — R059 Cough, unspecified: Secondary | ICD-10-CM

## 2020-05-12 DIAGNOSIS — Z20822 Contact with and (suspected) exposure to covid-19: Secondary | ICD-10-CM | POA: Insufficient documentation

## 2020-05-12 DIAGNOSIS — R0789 Other chest pain: Secondary | ICD-10-CM | POA: Diagnosis present

## 2020-05-12 DIAGNOSIS — R9389 Abnormal findings on diagnostic imaging of other specified body structures: Secondary | ICD-10-CM

## 2020-05-12 DIAGNOSIS — R52 Pain, unspecified: Secondary | ICD-10-CM

## 2020-05-12 MED ORDER — PROMETHAZINE-DM 6.25-15 MG/5ML PO SYRP: 5.0000 mL | ORAL_SOLUTION | Freq: Every evening | ORAL | 0 refills | Status: DC | PRN

## 2020-05-12 MED ORDER — BENZONATATE 100 MG PO CAPS: 100.0000 mg | ORAL_CAPSULE | Freq: Three times a day (TID) | ORAL | 0 refills | Status: DC | PRN

## 2020-05-12 NOTE — ED Triage Notes (Signed)
Symptoms started last night.  Complains of fever, chills, nausea and vomiting and a headache

## 2020-05-12 NOTE — ED Provider Notes (Signed)
[facility]   MRN: [id number] DOB: [birth year]  Subjective:   Jaime Adams is a 59 y.o. female presenting for 1 day history of acute onset malaise, fatigue, body aches, mild intermittent headache, cough, mild shortness of breath and mid to left-sided chest pain.  Patient has a longstanding history of smoking.  Has not tried medications for relief.  She is not Covid vaccinated.  Denies taking chronic medications.  No Known Allergies  Past Medical History:  Diagnosis Date  . High cholesterol   . Hypertension      History reviewed. No pertinent surgical history.  Family History  Problem Relation Age of Onset  . Hypertension Mother   . Alzheimer's disease Mother   . Cirrhosis Father   . Cancer Maternal Aunt   . Heart attack Brother   . Stroke Brother   . Aneurysm Brother   . Diabetes Brother     Social History   Tobacco Use  . Smoking status: Current Every Day Smoker    Packs/day: 1.00    Years: 20.00    Pack years: 20.00  . Smokeless tobacco: Never Used  Vaping Use  . Vaping Use: Never used  Substance Use Topics  . Alcohol use: Yes    Alcohol/week: 0.0 standard drinks  . Drug use: No    ROS   Objective:   Vitals: BP 139/86 (BP Location: Right Arm)   Pulse 79   Temp 97.9 F (36.6 C) (Oral)   SpO2 100%   Physical Exam Constitutional:      General: She is not in acute distress.    Appearance: Normal appearance. She is well-developed. She is ill-appearing. She is not toxic-appearing or diaphoretic.  HENT:     Head: Normocephalic and atraumatic.     Nose: Nose normal.     Mouth/Throat:     Mouth: Mucous membranes are moist.  Eyes:     Extraocular Movements: Extraocular movements intact.     Pupils: Pupils are equal, round, and reactive to light.  Cardiovascular:     Rate and Rhythm: Normal rate and regular rhythm.     Pulses: Normal pulses.     Heart sounds: Normal heart sounds. No murmur heard.  No friction rub. No gallop.     Pulmonary:     Effort: Pulmonary effort is normal. No respiratory distress.     Breath sounds: Normal breath sounds. No stridor. No wheezing, rhonchi or rales.  Skin:    General: Skin is warm and dry.     Findings: No rash.  Neurological:     Mental Status: She is alert and oriented to person, place, and time.  Psychiatric:        Mood and Affect: Mood normal.        Behavior: Behavior normal.        Thought Content: Thought content normal.     DG Chest 2 View  Result Date: 05/12/2020 CLINICAL DATA:  Cough and chest pain with shortness of breath. EXAM: CHEST - 2 VIEW COMPARISON:  09/01/2009 FINDINGS: Suspected emphysema. Cardiac and mediastinal margins appear normal. Atherosclerotic aortic arch. Possible left infrahilar nodule. The lungs appear otherwise clear. No blunting of the costophrenic angles. Thoracic spondylosis. IMPRESSION: 1. Possible left infrahilar nodule. CT chest recommended for further characterization. 2. Suspected emphysema. 3. Thoracic spondylosis. 4.  Aortic Atherosclerosis (ICD10-I70.0). Electronically Signed   By: Gaylyn Rong M.D.   On: 05/12/2020 17:41    Assessment and Plan :   PDMP not  reviewed this encounter.  1. Cough   2. Atypical chest pain   3. Shortness of breath   4. Smoker   5. Body aches   6. Abnormal chest x-ray     COVID-19 testing pending.  Recommended supportive care for now.  Patient has an abnormal chest x-ray including a possible left infrahilar nodule and suspected emphysema.  I discussed this with her and recommended that she pursue a chest CT scan by establishing care with a new primary care provider, information provided to her for this.  Patient's vital signs and physical exam findings otherwise stable for outpatient management. Counseled patient on potential for adverse effects with medications prescribed/recommended today, ER and return-to-clinic precautions discussed, patient verbalized understanding.    Wallis Bamberg,  [place] 05/12/20 1805

## 2020-05-12 NOTE — Discharge Instructions (Addendum)
Please establish care with a new primary care provider and make sure you pursue a chest CT with them as you had an abnormal chest x-ray today showing a possibly nodule.   We will notify you of your COVID-19 test results as they arrive and may take between 24 to 48 hours.  I encourage you to sign up for MyChart if you have not already done so as this can be the easiest way for [place] to communicate results to you online or through a phone app.  In the meantime, if you develop worsening symptoms including fever, chest pain, shortness of breath despite our current treatment plan then please report to the emergency room as this may be a sign of worsening status from possible COVID-19 infection.  Otherwise, we will manage this as a viral syndrome. For sore throat or cough try using a honey-based tea. Use 3 teaspoons of honey with juice squeezed from half lemon. Place shaved pieces of ginger into 1/2-1 cup of water and warm over stove top. Then mix the ingredients and repeat every 4 hours as needed. Please take Tylenol 500mg -650mg  every 6 hours for aches and pains, fevers. Hydrate very well with at least 2 liters of water. Eat light meals such as soups to replenish electrolytes and soft fruits, veggies. Start an antihistamine like Zyrtec, Allegra or Claritin for postnasal drainage, sinus congestion.

## 2020-05-13 LAB — SARS CORONAVIRUS 2 (TAT 6-24 HRS): SARS Coronavirus 2: NEGATIVE

## 2020-05-23 ENCOUNTER — Ambulatory Visit (INDEPENDENT_AMBULATORY_CARE_PROVIDER_SITE_OTHER): Payer: 59 | Admitting: Primary Care

## 2020-05-23 ENCOUNTER — Encounter (INDEPENDENT_AMBULATORY_CARE_PROVIDER_SITE_OTHER): Payer: Self-pay | Admitting: Primary Care

## 2020-05-23 ENCOUNTER — Other Ambulatory Visit: Payer: Self-pay

## 2020-05-23 VITALS — BP 174/83 | HR 71 | Ht 70.0 in | Wt 130.4 lb

## 2020-05-23 DIAGNOSIS — Z23 Encounter for immunization: Secondary | ICD-10-CM | POA: Diagnosis not present

## 2020-05-23 DIAGNOSIS — Z1211 Encounter for screening for malignant neoplasm of colon: Secondary | ICD-10-CM

## 2020-05-23 DIAGNOSIS — Z720 Tobacco use: Secondary | ICD-10-CM

## 2020-05-23 DIAGNOSIS — Z09 Encounter for follow-up examination after completed treatment for conditions other than malignant neoplasm: Secondary | ICD-10-CM

## 2020-05-23 DIAGNOSIS — Z7689 Persons encountering health services in other specified circumstances: Secondary | ICD-10-CM

## 2020-05-23 DIAGNOSIS — Z1231 Encounter for screening mammogram for malignant neoplasm of breast: Secondary | ICD-10-CM

## 2020-05-23 NOTE — Progress Notes (Signed)
Assessment and Plan:    HPI Ms. Teona Vargus is a  59 y.o.female presents for follow up from the ED on  05/12/20, for Cough, Atypical chest pain Shortness of breath, Smoker, Body aches and Abnormal chest x-ray including a possible left infrahilar nodule and suspected emphysema discharge on the same day . Previously seen in Urgent care on 10/21/2i for URI- COVID test negative.  Past Medical History:  Diagnosis Date   High cholesterol    Hypertension      No Known Allergies    Current Outpatient Medications on File Prior to Visit  Medication Sig Dispense Refill   acetaminophen (TYLENOL) 500 MG tablet Take 1 tablet (500 mg total) by mouth every 6 (six) hours as needed. 30 tablet 0   aspirin EC 81 MG tablet Take 81 mg by mouth daily. Swallow whole.     benzonatate (TESSALON) 100 MG capsule Take 1-2 capsules (100-200 mg total) by mouth 3 (three) times daily as needed. 60 capsule 0   ibuprofen (ADVIL,MOTRIN) 600 MG tablet Take 1 tablet (600 mg total) by mouth every 6 (six) hours as needed. (Patient not taking: Reported on 05/23/2020) 30 tablet 0   promethazine-dextromethorphan (PROMETHAZINE-DM) 6.25-15 MG/5ML syrup Take 5 mLs by mouth at bedtime as needed for cough. (Patient not taking: Reported on 05/23/2020) 100 mL 0   No current facility-administered medications on file prior to visit.    ROS: all negative except above.   Physical Exam: Filed Weights   05/23/20 0945  Weight: 130 lb 6.4 oz (59.1 kg)   BP (!) 174/83    Pulse 71    Ht 5\' 10"  (1.778 m)    Wt 130 lb 6.4 oz (59.1 kg)    SpO2 100%    BMI 18.71 kg/m  General Appearance: Well nourished, in no apparent distress. Eyes: PERRLA, EOMs, conjunctiva no swelling or erythema Sinuses: No Frontal/maxillary tenderness ENT/Mouth: Ext aud canals clear, TMs without erythema, bulging. No erythema, swelling, or exudate on post pharynx.  Tonsils not swollen or erythematous. Hearing normal.  Neck: Supple, thyroid normal.   Respiratory: Respiratory effort normal, BS equal bilaterally without rales, rhonchi, wheezing or stridor.  Cardio: RRR with no MRGs. Brisk peripheral pulses without edema.  Abdomen: Soft, + BS.  Non tender, no guarding, rebound, hernias, masses. Lymphatics: Non tender without lymphadenopathy.  Musculoskeletal: Full ROM, 5/5 strength, normal gait.  Skin: Warm, dry without rashes, lesions, ecchymosis.  Neuro: Cranial nerves intact. Normal muscle tone, no cerebellar symptoms. Sensation intact.  Psych: Awake and oriented X 3, normal affect, Insight and Judgment appropriate.    Blakely was seen today for hospitalization follow-up.  Diagnoses and all orders for this visit:  Hospital discharge follow-up Discharge summary recommended follow up to establish care list of providers listed   Encounter for screening mammogram for malignant neoplasm of breast -     MM Digital Diagnostic Bilat; Future  Tobacco abuse Discussed increased recurrent cough and URI risk for lung cancer and other respiratory diseases recommend cessation.  This will be reminded at each clinical visit.  Encounter to establish care Establish Care with new PCP  Colon cancer screening -     Ambulatory referral to Gastroenterology  Other orders -     Tdap vaccine greater than or equal to 7yo IM   Kathaleen Maser, NP 10:09 AM

## 2020-05-23 NOTE — Patient Instructions (Addendum)
Managing Your Hypertension Hypertension is commonly called high blood pressure. This is when the force of your blood pressing against the walls of your arteries is too strong. Arteries are blood vessels that carry blood from your heart throughout your body. Hypertension forces the heart to work harder to pump blood, and may cause the arteries to become narrow or stiff. Having untreated or uncontrolled hypertension can cause heart attack, stroke, kidney disease, and other problems. What are blood pressure readings? A blood pressure reading consists of a higher number over a lower number. Ideally, your blood pressure should be below 120/80. The first ("top") number is called the systolic pressure. It is a measure of the pressure in your arteries as your heart beats. The second ("bottom") number is called the diastolic pressure. It is a measure of the pressure in your arteries as the heart relaxes. What does my blood pressure reading mean? Blood pressure is classified into four stages. Based on your blood pressure reading, your health care provider may use the following stages to determine what type of treatment you need, if any. Systolic pressure and diastolic pressure are measured in a unit called mm Hg. Normal  Systolic pressure: below 120.  Diastolic pressure: below 80. Elevated  Systolic pressure: 120-129.  Diastolic pressure: below 80. Hypertension stage 1  Systolic pressure: 130-139.  Diastolic pressure: 80-89. Hypertension stage 2  Systolic pressure: 140 or above.  Diastolic pressure: 90 or above. What health risks are associated with hypertension? Managing your hypertension is an important responsibility. Uncontrolled hypertension can lead to:  A heart attack.  A stroke.  A weakened blood vessel (aneurysm).  Heart failure.  Kidney damage.  Eye damage.  Metabolic syndrome.  Memory and concentration problems. What changes can I make to manage my  hypertension? Hypertension can be managed by making lifestyle changes and possibly by taking medicines. Your health care provider will help you make a plan to bring your blood pressure within a normal range. Eating and drinking   Eat a diet that is high in fiber and potassium, and low in salt (sodium), added sugar, and fat. An example eating plan is called the DASH (Dietary Approaches to Stop Hypertension) diet. To eat this way: ? Eat plenty of fresh fruits and vegetables. Try to fill half of your plate at each meal with fruits and vegetables. ? Eat whole grains, such as whole wheat pasta, brown rice, or whole grain bread. Fill about one quarter of your plate with whole grains. ? Eat low-fat diary products. ? Avoid fatty cuts of meat, processed or cured meats, and poultry with skin. Fill about one quarter of your plate with lean proteins such as fish, chicken without skin, beans, eggs, and tofu. ? Avoid premade and processed foods. These tend to be higher in sodium, added sugar, and fat.  Reduce your daily sodium intake. Most people with hypertension should eat less than 1,500 mg of sodium a day.  Limit alcohol intake to no more than 1 drink a day for nonpregnant women and 2 drinks a day for men. One drink equals 12 oz of beer, 5 oz of wine, or 1 oz of hard liquor. Lifestyle  Work with your health care provider to maintain a healthy body weight, or to lose weight. Ask what an ideal weight is for you.  Get at least 30 minutes of exercise that causes your heart to beat faster (aerobic exercise) most days of the week. Activities may include walking, swimming, or biking.  Include exercise   to strengthen your muscles (resistance exercise), such as weight lifting, as part of your weekly exercise routine. Try to do these types of exercises for 30 minutes at least 3 days a week.  Do not use any products that contain nicotine or tobacco, such as cigarettes and e-cigarettes. If you need help quitting,  ask your health care provider.  Control any long-term (chronic) conditions you have, such as high cholesterol or diabetes. Monitoring  Monitor your blood pressure at home as told by your health care provider. Your personal target blood pressure may vary depending on your medical conditions, your age, and other factors.  Have your blood pressure checked regularly, as often as told by your health care provider. Working with your health care provider  Review all the medicines you take with your health care provider because there may be side effects or interactions.  Talk with your health care provider about your diet, exercise habits, and other lifestyle factors that may be contributing to hypertension.  Visit your health care provider regularly. Your health care provider can help you create and adjust your plan for managing hypertension. Will I need medicine to control my blood pressure? Your health care provider may prescribe medicine if lifestyle changes are not enough to get your blood pressure under control, and if:  Your systolic blood pressure is 130 or higher.  Your diastolic blood pressure is 80 or higher. Take medicines only as told by your health care provider. Follow the directions carefully. Blood pressure medicines must be taken as prescribed. The medicine does not work as well when you skip doses. Skipping doses also puts you at risk for problems. Contact a health care provider if:  You think you are having a reaction to medicines you have taken.  You have repeated (recurrent) headaches.  You feel dizzy.  You have swelling in your ankles.  You have trouble with your vision. Get help right away if:  You develop a severe headache or confusion.  You have unusual weakness or numbness, or you feel faint.  You have severe pain in your chest or abdomen.  You vomit repeatedly.  You have trouble breathing. Summary  Hypertension is when the force of blood pumping  through your arteries is too strong. If this condition is not controlled, it may put you at risk for serious complications.  Your personal target blood pressure may vary depending on your medical conditions, your age, and other factors. For most people, a normal blood pressure is less than 120/80.  Hypertension is managed by lifestyle changes, medicines, or both. Lifestyle changes include weight loss, eating a healthy, low-sodium diet, exercising more, and limiting alcohol. This information is not intended to replace advice given to you by your health care provider. Make sure you discuss any questions you have with your health care provider. Document Revised: 09/29/2018 Document Reviewed: 05/05/2016 Elsevier Patient Education  2020 [employer].     https://www.cdc.gov/vaccines/hcp/vis/vis-statements/tdap.pdf">  Tdap (Tetanus, Diphtheria, Pertussis) Vaccine: What You Need to Know 1. Why get vaccinated? Tdap vaccine can prevent tetanus, diphtheria, and pertussis. Diphtheria and pertussis spread from person to person. Tetanus enters the body through cuts or wounds.  TETANUS (T) causes painful stiffening of the muscles. Tetanus can lead to serious health problems, including being unable to open the mouth, having trouble swallowing and breathing, or death.  DIPHTHERIA (D) can lead to difficulty breathing, heart failure, paralysis, or death.  PERTUSSIS (aP), also known as "whooping cough," can cause uncontrollable, violent coughing which makes it  hard to breathe, eat, or drink. Pertussis can be extremely serious in babies and young children, causing pneumonia, convulsions, brain damage, or death. In teens and adults, it can cause weight loss, loss of bladder control, passing out, and rib fractures from severe coughing. 2. Tdap vaccine Tdap is only for children 7 years and older, adolescents, and adults.  Adolescents should receive a single dose of Tdap, preferably at age 35 or 12 years. Pregnant  women should get a dose of Tdap during every pregnancy, to protect the newborn from pertussis. Infants are most at risk for severe, life-threatening complications from pertussis. Adults who have never received Tdap should get a dose of Tdap. Also, adults should receive a booster dose every 10 years, or earlier in the case of a severe and dirty wound or burn. Booster doses can be either Tdap or Td (a different vaccine that protects against tetanus and diphtheria but not pertussis). Tdap may be given at the same time as other vaccines. 3. Talk with your health care provider Tell your vaccine provider if the person getting the vaccine:  Has had an allergic reaction after a previous dose of any vaccine that protects against tetanus, diphtheria, or pertussis, or has any severe, life-threatening allergies.  Has had a coma, decreased level of consciousness, or prolonged seizures within 7 days after a previous dose of any pertussis vaccine (DTP, DTaP, or Tdap).  Has seizures or another nervous system problem.  Has ever had Guillain-Barr Syndrome (also called GBS).  Has had severe pain or swelling after a previous dose of any vaccine that protects against tetanus or diphtheria. In some cases, your health care provider may decide to postpone Tdap vaccination to a future visit.  People with minor illnesses, such as a cold, may be vaccinated. People who are moderately or severely ill should usually wait until they recover before getting Tdap vaccine.  Your health care provider can give you more information. 4. Risks of a vaccine reaction  Pain, redness, or swelling where the shot was given, mild fever, headache, feeling tired, and nausea, vomiting, diarrhea, or stomachache sometimes happen after Tdap vaccine. People sometimes faint after medical procedures, including vaccination. Tell your provider if you feel dizzy or have vision changes or ringing in the ears.  As with any medicine, there is a very  remote chance of a vaccine causing a severe allergic reaction, other serious injury, or death. 5. What if there is a serious problem? An allergic reaction could occur after the vaccinated person leaves the clinic. If you see signs of a severe allergic reaction (hives, swelling of the face and throat, difficulty breathing, a fast heartbeat, dizziness, or weakness), call 9-1-1 and get the person to the nearest hospital. For other signs that concern you, call your health care provider.  Adverse reactions should be reported to the Vaccine Adverse Event Reporting System (VAERS). Your health care provider will usually file this report, or you can do it yourself. Visit the VAERS website at www.vaers.LAgents.no or call (864) 703-2098. VAERS is only for reporting reactions, and VAERS staff do not give medical advice. 6. The National Vaccine Injury Compensation Program The [employer] Vaccine Injury Compensation Program (VICP) is a federal program that was created to compensate people who may have been injured by certain vaccines. Visit the VICP website at SpiritualWord.at or call (410) 571-2019 to learn about the program and about filing a claim. There is a time limit to file a claim for compensation. 7. How can I learn more?  Ask your health care provider.  Call your local or state health department.  Contact the Centers for Disease Control and Prevention (CDC): ? Call 626 756 3794 (1-800-CDC-INFO) or ? Visit CDC's website at PicCapture.uy Vaccine Information Statement Tdap (Tetanus, Diphtheria, Pertussis) Vaccine (09/20/2018) This information is not intended to replace advice given to you by your health care provider. Make sure you discuss any questions you have with your health care provider. Document Revised: 09/29/2018 Document Reviewed: 10/02/2018 Elsevier Patient Education  2020 [employer].

## 2020-05-23 NOTE — Progress Notes (Signed)
HFU-05/12/20 Pt states that she feel better. Has not smoked in 1 week.

## 2020-07-11 ENCOUNTER — Ambulatory Visit (INDEPENDENT_AMBULATORY_CARE_PROVIDER_SITE_OTHER): Payer: 59 | Admitting: Primary Care

## 2020-07-24 ENCOUNTER — Encounter (INDEPENDENT_AMBULATORY_CARE_PROVIDER_SITE_OTHER): Payer: Self-pay | Admitting: Primary Care

## 2020-07-24 ENCOUNTER — Other Ambulatory Visit (HOSPITAL_COMMUNITY)
Admission: RE | Admit: 2020-07-24 | Discharge: 2020-07-24 | Disposition: A | Discharge status: Home / Self Care | Payer: Self-pay | Source: Ambulatory Visit | Attending: Primary Care | Admitting: Primary Care

## 2020-07-24 ENCOUNTER — Ambulatory Visit (INDEPENDENT_AMBULATORY_CARE_PROVIDER_SITE_OTHER): Payer: Self-pay | Admitting: Primary Care

## 2020-07-24 ENCOUNTER — Other Ambulatory Visit: Payer: Self-pay

## 2020-07-24 VITALS — BP 139/76 | HR 83 | Temp 99.2°F | Wt 127.4 lb

## 2020-07-24 DIAGNOSIS — Z139 Encounter for screening, unspecified: Secondary | ICD-10-CM

## 2020-07-24 DIAGNOSIS — Z720 Tobacco use: Secondary | ICD-10-CM

## 2020-07-24 DIAGNOSIS — Z13220 Encounter for screening for lipoid disorders: Secondary | ICD-10-CM

## 2020-07-24 DIAGNOSIS — Z1211 Encounter for screening for malignant neoplasm of colon: Secondary | ICD-10-CM

## 2020-07-24 DIAGNOSIS — Z124 Encounter for screening for malignant neoplasm of cervix: Secondary | ICD-10-CM | POA: Insufficient documentation

## 2020-07-24 DIAGNOSIS — N898 Other specified noninflammatory disorders of vagina: Secondary | ICD-10-CM | POA: Insufficient documentation

## 2020-07-24 NOTE — Progress Notes (Signed)
Established Patient Office Visit  Subjective:  Patient ID: Jaime Adams, female    DOB: June 08, 1961  Age: 60 y.o. MRN: [id number]  CC:  Chief Complaint  Patient presents with  . PAP    HPI Ms Jaime Adams is a 60 year old female presents for woman wellness visit.   Past Medical History:  Diagnosis Date  . High cholesterol   . Hypertension     No past surgical history on file.  Family History  Problem Relation Age of Onset  . Hypertension Mother   . Alzheimer's disease Mother   . Cirrhosis Father   . Cancer Maternal Aunt   . Heart attack Brother   . Stroke Brother   . Aneurysm Brother   . Diabetes Brother     Social History   Socioeconomic History  . Marital status: Married    Spouse name: Not on file  . Number of children: Not on file  . Years of education: Not on file  . Highest education level: Not on file  Occupational History  . Not on file  Tobacco Use  . Smoking status: Current Every Day Smoker    Packs/day: 1.00    Years: 20.00    Pack years: 20.00  . Smokeless tobacco: Never Used  Vaping Use  . Vaping Use: Never used  Substance and Sexual Activity  . Alcohol use: Yes    Alcohol/week: 0.0 standard drinks  . Drug use: No  . Sexual activity: Not Currently  Other Topics Concern  . Not on file  Social History Narrative   Lives alone.  Rents a room.  Has 2 children.  Works at [employer].  Education: high school.   Social Determinants of Health   Financial Resource Strain: Not on file  Food Insecurity: Not on file  Transportation Needs: Not on file  Physical Activity: Not on file  Stress: Not on file  Social Connections: Not on file  Intimate Partner Violence: Not on file    Outpatient Medications Prior to Visit  Medication Sig Dispense Refill  . aspirin EC 81 MG tablet Take 81 mg by mouth daily. Swallow whole.    Marland Kitchen acetaminophen (TYLENOL) 500 MG tablet Take 1 tablet (500 mg total) by mouth every 6 (six) hours as needed. 30 tablet 0   . promethazine-dextromethorphan (PROMETHAZINE-DM) 6.25-15 MG/5ML syrup Take 5 mLs by mouth at bedtime as needed for cough. 100 mL 0  . benzonatate (TESSALON) 100 MG capsule Take 1-2 capsules (100-200 mg total) by mouth 3 (three) times daily as needed. 60 capsule 0  . ibuprofen (ADVIL,MOTRIN) 600 MG tablet Take 1 tablet (600 mg total) by mouth every 6 (six) hours as needed. 30 tablet 0   No facility-administered medications prior to visit.    No Known Allergies  ROS Review of Systems  Genitourinary: Positive for vaginal discharge.  All other systems reviewed and are negative.     Objective:    Physical Exam  General appearance - well appearing, and in no distress             Mental status - alert, oriented to person, place, and time             Psych:  She has a normal mood and affect             Skin - warm and dry, normal color, no suspicious lesions noted             Chest - effort normal, all  lung fields clear to auscultation bilaterally             Heart - normal rate and regular rhythm             Neck:  midline trachea, no thyromegaly or nodules             Breasts - breasts appear normal, no suspicious masses, no skin or nipple changes or     axillary nodes             Abdomen - soft, nontender, nondistended, no masses or organomegaly             Pelvic - VULVA: normal appearing vulva with no masses, tenderness or lesions   VAGINA: normal appearing vagina with normal color and discharge, no lesions       CERVIX: normal appearing cervix without discharge or lesions, no CMT             Thin prep pap is done with HR HPV cotesting             UTERUS: uterus is felt to be normal size, shape, consistency and nontender              ADNEXA: No adnexal masses or tenderness noted.             Rectal - deferred             Extremities:  No swelling or varicosities noted    BP 139/76 (BP Location: Right Arm, Patient Position: Sitting)   Pulse 83   Temp 99.2 F (37.3 C)   Wt 127  lb 6.4 oz (57.8 kg)   SpO2 99%   BMI 18.28 kg/m  Wt Readings from Last 3 Encounters:  07/24/20 127 lb 6.4 oz (57.8 kg)  05/23/20 130 lb 6.4 oz (59.1 kg)  03/20/16 152 lb 11.2 oz (69.3 kg)   pachaes /gum  Health Maintenance Due  Topic Date Due  . COVID-19 Vaccine (1) Never done  . PAP SMEAR-Modifier  Never done  . COLONOSCOPY (Pts 45-68yrs Insurance coverage will need to be confirmed)  Never done  . MAMMOGRAM  01/23/2012    There are no preventive care reminders to display for this patient.  Lab Results  Component Value Date   TSH 0.806 01/14/2010   Lab Results  Component Value Date   WBC 6.5 07/24/2020   HGB 11.2 07/24/2020   HCT 32.9 (L) 07/24/2020   MCV 96 07/24/2020   PLT 199 07/24/2020   Lab Results  Component Value Date   NA 141 07/24/2020   K 3.1 (L) 07/24/2020   CO2 24 07/24/2020   GLUCOSE 93 07/24/2020   BUN 14 07/24/2020   CREATININE 0.77 07/24/2020   BILITOT 0.6 07/24/2020   ALKPHOS 90 07/24/2020   AST 25 07/24/2020   ALT 7 07/24/2020   PROT 7.9 07/24/2020   ALBUMIN 4.6 07/24/2020   CALCIUM 9.5 07/24/2020   ANIONGAP 10 03/19/2016   Lab Results  Component Value Date   CHOL 211 (H) 07/24/2020   Lab Results  Component Value Date   HDL 88 07/24/2020   Lab Results  Component Value Date   LDLCALC 111 (H) 07/24/2020   Lab Results  Component Value Date   TRIG 67 07/24/2020   Lab Results  Component Value Date   CHOLHDL 2.4 07/24/2020   Lab Results  Component Value Date   HGBA1C 5.3 09/24/2009      Assessment & Plan:  Dixie was seen  today for pap.  Diagnoses and all orders for this visit:  Cervical cancer screening The USPSTF recommendations screening of cervical cancer every 3 years with cervical cytology.   -     Cytology - PAP([place])  Vaginal discharge -     Cervicovaginal ancillary only  Screening due -     CBC with Differential -     CMP14+EGFR  Tobacco abuse Patient is aware of a increased risk for lung cancer  and other respiratory diseases recommend cessation.  This will be reminded at each clinical visit.  -  Lipid screening -     Lipid Panel  Colon cancer screening Refer to GI      Follow-up: Return if symptoms worsen or fail to improve.    Kerin Perna, NP

## 2020-07-24 NOTE — Patient Instructions (Signed)

## 2020-07-24 NOTE — Progress Notes (Signed)
Pap smear  bloodwork

## 2020-07-25 LAB — CMP14+EGFR
ALT: 7 IU/L (ref 0–32)
AST: 25 IU/L (ref 0–40)
Albumin/Globulin Ratio: 1.4 (ref 1.2–2.2)
Albumin: 4.6 g/dL (ref 3.8–4.9)
Alkaline Phosphatase: 90 IU/L (ref 44–121)
BUN/Creatinine Ratio: 18 (ref 12–28)
BUN: 14 mg/dL (ref 8–27)
Bilirubin Total: 0.6 mg/dL (ref 0.0–1.2)
CO2: 24 mmol/L (ref 20–29)
Calcium: 9.5 mg/dL (ref 8.7–10.3)
Chloride: 101 mmol/L (ref 96–106)
Creatinine, Ser: 0.77 mg/dL (ref 0.57–1.00)
GFR calc Af Amer: 97 mL/min/{1.73_m2} (ref >59)
GFR calc non Af Amer: 84 mL/min/{1.73_m2} (ref >59)
Globulin, Total: 3.3 g/dL (ref 1.5–4.5)
Glucose: 93 mg/dL (ref 65–99)
Potassium: 3.1 mmol/L — ABNORMAL LOW (ref 3.5–5.2)
Sodium: 141 mmol/L (ref 134–144)
Total Protein: 7.9 g/dL (ref 6.0–8.5)

## 2020-07-25 LAB — CBC WITH DIFFERENTIAL/PLATELET
Basophils Absolute: 0.1 10*3/uL (ref 0.0–0.2)
Basos: 1 %
EOS (ABSOLUTE): 0.1 10*3/uL (ref 0.0–0.4)
Eos: 2 %
Hematocrit: 32.9 % — ABNORMAL LOW (ref 34.0–46.6)
Hemoglobin: 11.2 g/dL (ref 11.1–15.9)
Immature Grans (Abs): 0 10*3/uL (ref 0.0–0.1)
Immature Granulocytes: 0 %
Lymphocytes Absolute: 2.5 10*3/uL (ref 0.7–3.1)
Lymphs: 38 %
MCH: 32.7 pg (ref 26.6–33.0)
MCHC: 34 g/dL (ref 31.5–35.7)
MCV: 96 fL (ref 79–97)
Monocytes Absolute: 0.6 10*3/uL (ref 0.1–0.9)
Monocytes: 9 %
Neutrophils Absolute: 3.3 10*3/uL (ref 1.4–7.0)
Neutrophils: 50 %
Platelets: 199 10*3/uL (ref 150–450)
RBC: 3.42 x10E6/uL — ABNORMAL LOW (ref 3.77–5.28)
RDW: 14.2 % (ref 11.7–15.4)
WBC: 6.5 10*3/uL (ref 3.4–10.8)

## 2020-07-25 LAB — CERVICOVAGINAL ANCILLARY ONLY
Bacterial Vaginitis (gardnerella): POSITIVE — AB
Candida Glabrata: NEGATIVE
Candida Vaginitis: NEGATIVE
Chlamydia: NEGATIVE
Comment: NEGATIVE
Comment: NEGATIVE
Comment: NEGATIVE
Comment: NEGATIVE
Comment: NEGATIVE
Comment: NORMAL
Neisseria Gonorrhea: NEGATIVE
Trichomonas: NEGATIVE

## 2020-07-25 LAB — LIPID PANEL
Chol/HDL Ratio: 2.4 ratio (ref 0.0–4.4)
Cholesterol, Total: 211 mg/dL — ABNORMAL HIGH (ref 100–199)
HDL: 88 mg/dL (ref >39)
LDL Chol Calc (NIH): 111 mg/dL — ABNORMAL HIGH (ref 0–99)
Triglycerides: 67 mg/dL (ref 0–149)
VLDL Cholesterol Cal: 12 mg/dL (ref 5–40)

## 2020-07-28 ENCOUNTER — Other Ambulatory Visit (INDEPENDENT_AMBULATORY_CARE_PROVIDER_SITE_OTHER): Payer: Self-pay | Admitting: Primary Care

## 2020-07-28 DIAGNOSIS — E782 Mixed hyperlipidemia: Secondary | ICD-10-CM

## 2020-07-28 DIAGNOSIS — E876 Hypokalemia: Secondary | ICD-10-CM

## 2020-07-28 MED ORDER — ATORVASTATIN CALCIUM 20 MG PO TABS: 20.0000 mg | ORAL_TABLET | Freq: Every day | ORAL | 3 refills | Status: DC

## 2020-07-28 MED ORDER — POTASSIUM CHLORIDE CRYS ER 10 MEQ PO TBCR: 10.0000 meq | EXTENDED_RELEASE_TABLET | Freq: Once | ORAL | 1 refills | Status: DC

## 2020-07-29 ENCOUNTER — Telehealth (INDEPENDENT_AMBULATORY_CARE_PROVIDER_SITE_OTHER): Payer: Self-pay | Admitting: Primary Care

## 2020-07-29 ENCOUNTER — Telehealth (INDEPENDENT_AMBULATORY_CARE_PROVIDER_SITE_OTHER): Payer: Self-pay

## 2020-07-29 LAB — CYTOLOGY - PAP
Diagnosis: NEGATIVE
Diagnosis: REACTIVE

## 2020-07-29 NOTE — Telephone Encounter (Signed)
Sent to PCP ?

## 2020-07-29 NOTE — Telephone Encounter (Signed)
Please advise 

## 2020-07-29 NOTE — Telephone Encounter (Signed)
Copied from CRM [id number]. Topic: General - Other >> Jul 29, 2020 10:08 AM Pawlus, Maxine Glenn A wrote: Reason for CRM: Pharmacist from [id number] - [place], [place] - [address] called in to get some more clarifiaction regarding the perscription for potassium chloride SA (KLOR-CON) 10 MEQ tablet. Please call back [phone].  Please advice

## 2020-07-29 NOTE — Telephone Encounter (Signed)
[facility] calling and needs directions re sent for the potassium chloride SA (KLOR-CON) 10 MEQ tablet  current directions will not work: Proofreader - Route: Take 1 tablet (10 mEq total) by mouth once for 1 dose. - Scientific laboratory technician [facility] - [place], [place] - [address]

## 2020-07-31 ENCOUNTER — Telehealth (INDEPENDENT_AMBULATORY_CARE_PROVIDER_SITE_OTHER): Payer: Self-pay

## 2020-07-31 NOTE — Telephone Encounter (Signed)
-----   Message from Grayce Sessions, NP sent at 07/29/2020  5:00 PM EST ----- Pap is normal not due for 3 years

## 2020-07-31 NOTE — Telephone Encounter (Signed)
Per DPR left voicemail notifying patient that pap was normal repeat in 3 years. Potassium is low sent in a supplement . Cholesterol is elevated this can lead to heart attack and stroke. To lower your number you can decrease your fatty foods, red meat, cheese, milk and increase fiber like whole grains  Sent in atorvastatin 2omg at bedtime. Return call to [facility] at [phone] with any questions or concerns. Maryjean Morn, CMA

## 2020-10-29 ENCOUNTER — Telehealth: Payer: Self-pay | Admitting: *Deleted

## 2020-10-29 NOTE — Telephone Encounter (Signed)
Missed pre visit appointment . Attempted patient at both phone numbers on file and left messages. Requested patient call back to reschedule to avoid cancellation of colonoscopy 11/12/20.

## 2020-10-31 NOTE — Telephone Encounter (Signed)
No return call from patient to reschedule PV.  Missed appointment letter sent and colonoscopy cancelled.

## 2020-11-12 ENCOUNTER — Encounter: Payer: Self-pay | Admitting: Internal Medicine

## 2021-03-13 IMAGING — DX DG CHEST 2V
2 series · 2 of 2 positions shown · non-contrast
Comparison: 09/01/2009

CLINICAL DATA: Cough and chest pain with shortness of breath.

EXAM:
CHEST - 2 VIEW

[chest pa]
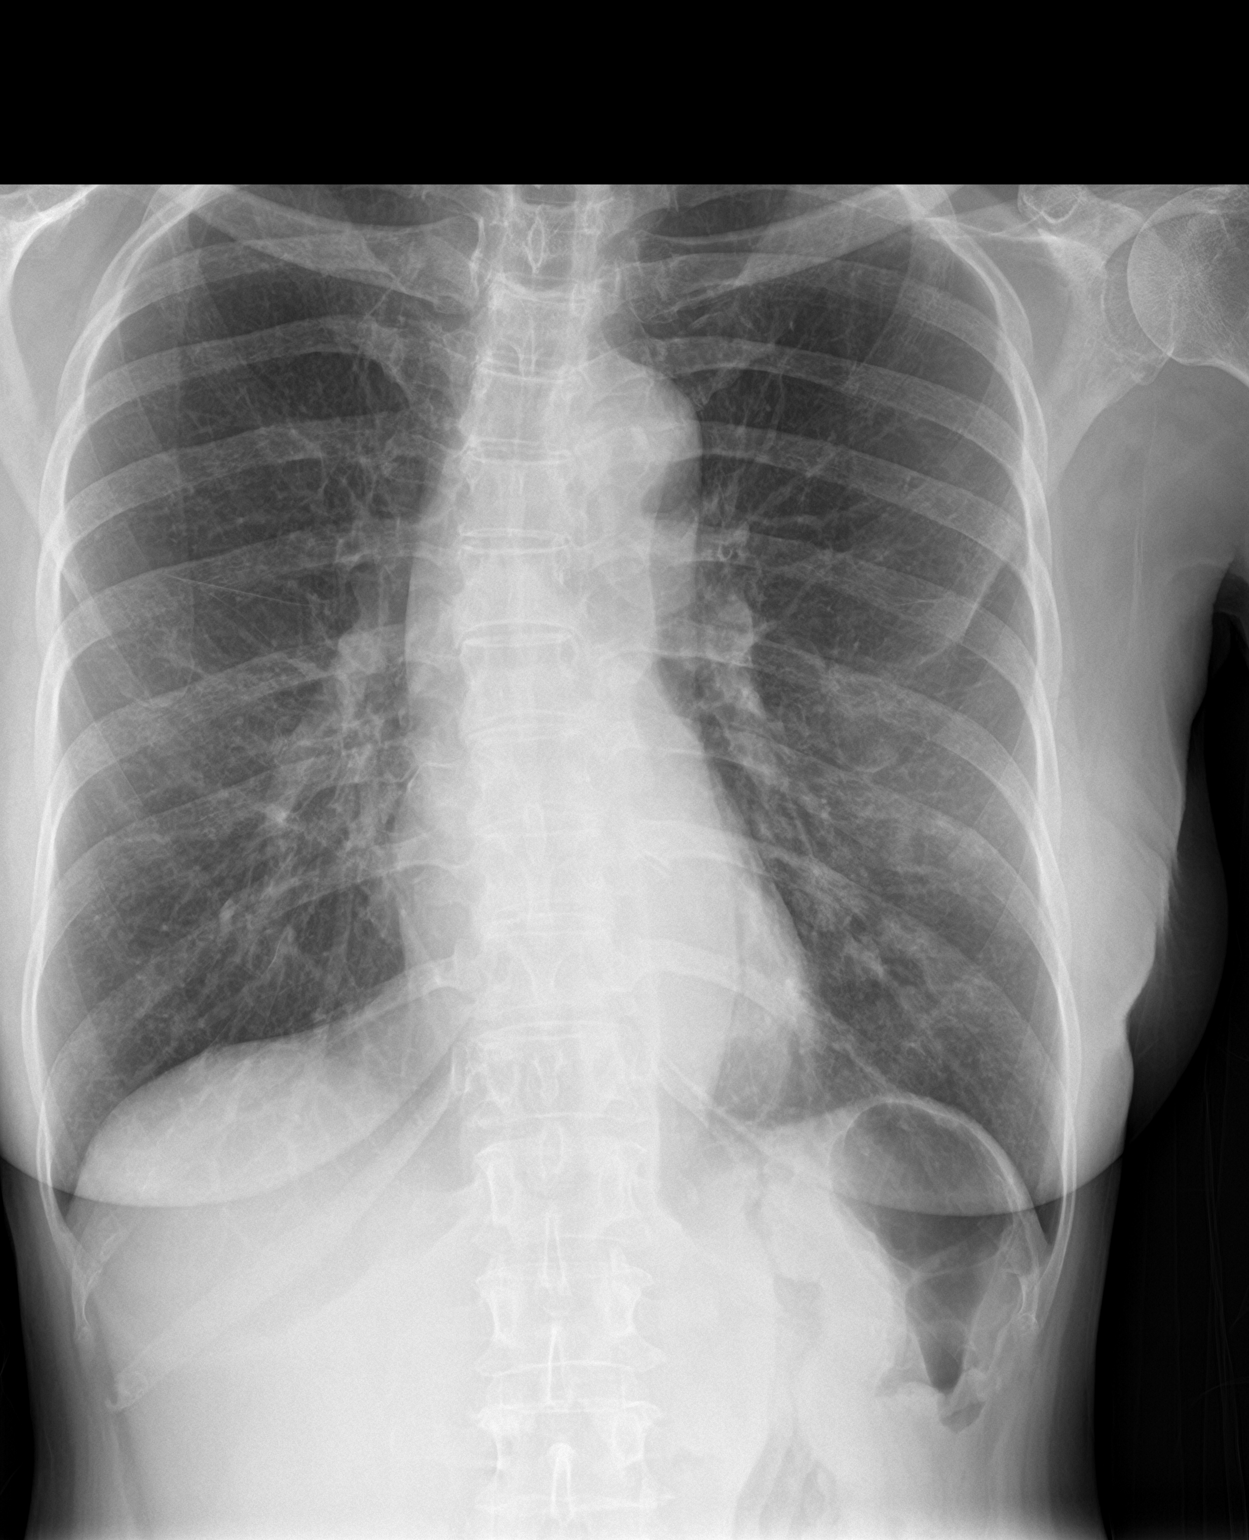

[chest lat]
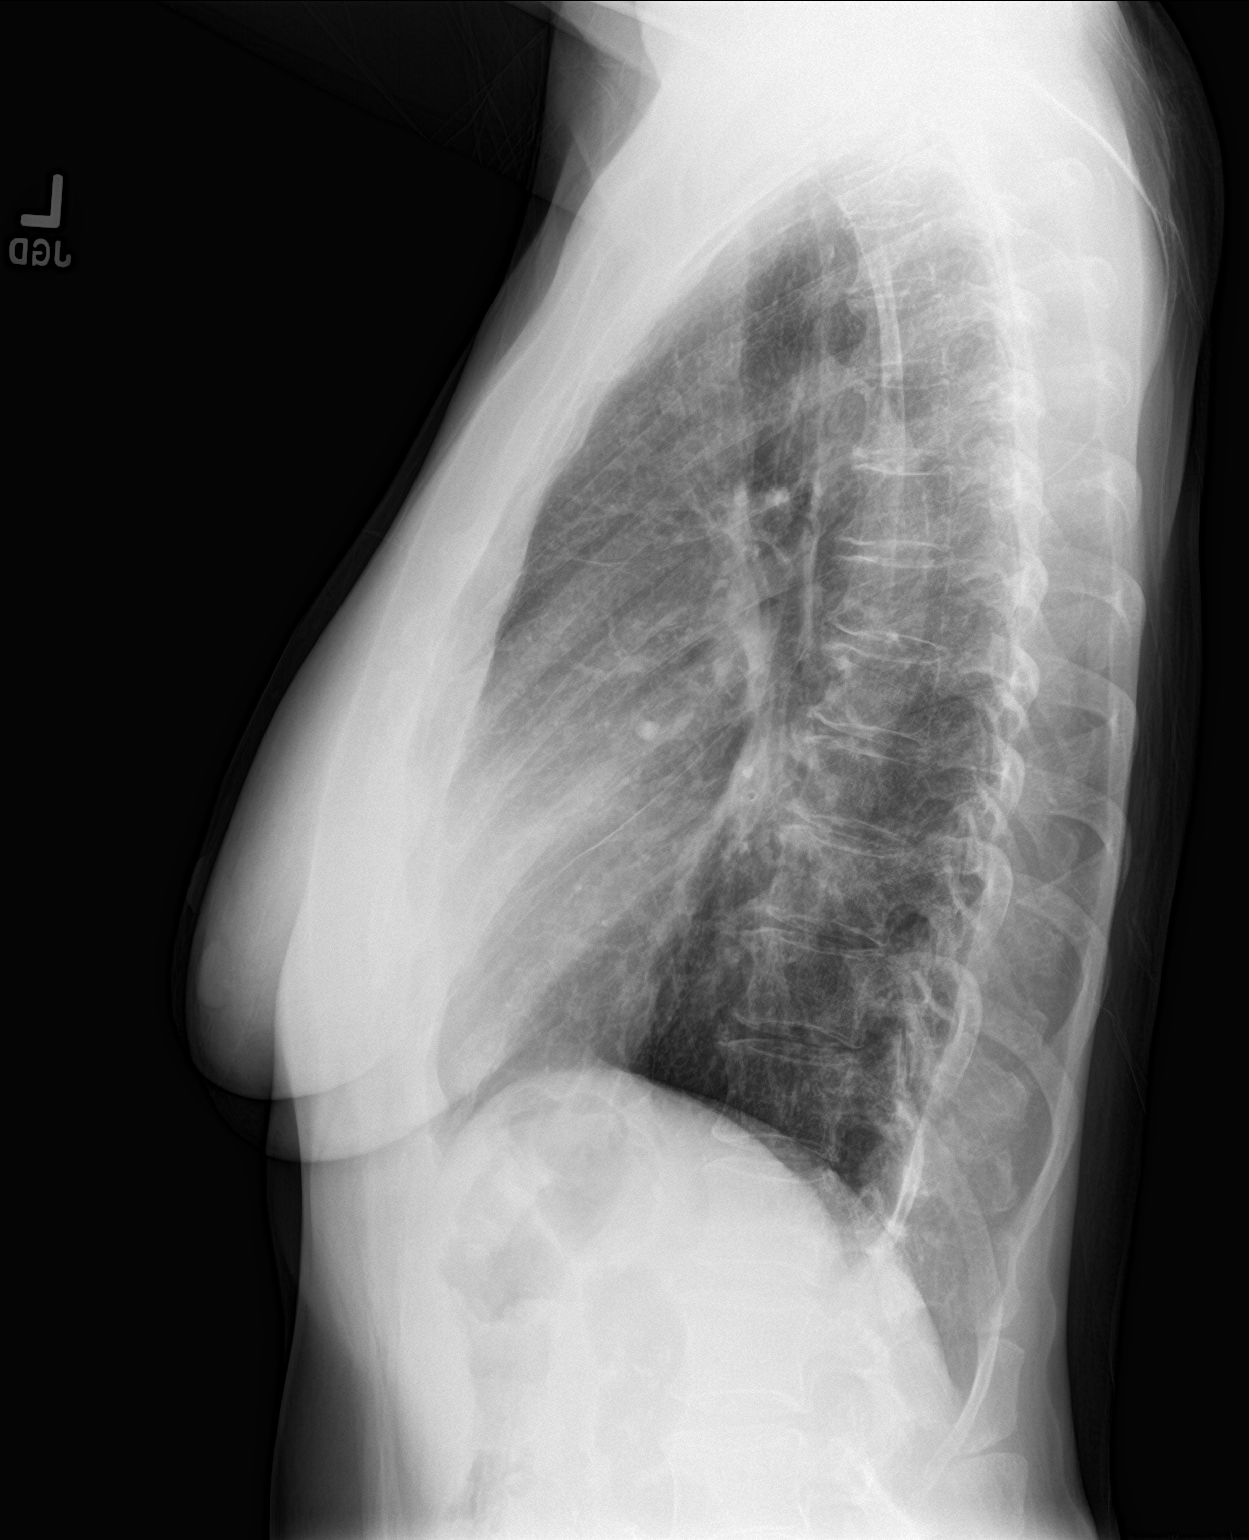

[2 of 2 positions shown; findings below may reference images not displayed]

FINDINGS: Suspected emphysema. Cardiac and mediastinal margins appear normal.
Atherosclerotic aortic arch.

Possible left infrahilar nodule. The lungs appear otherwise clear.
No blunting of the costophrenic angles.

Thoracic spondylosis.
IMPRESSION: 1. Possible left infrahilar nodule. CT chest recommended for further
characterization.
2. Suspected emphysema.
3. Thoracic spondylosis.
4.  Aortic Atherosclerosis ([id number]).

## 2021-05-19 ENCOUNTER — Ambulatory Visit (INDEPENDENT_AMBULATORY_CARE_PROVIDER_SITE_OTHER): Payer: 59

## 2021-05-19 ENCOUNTER — Encounter (HOSPITAL_COMMUNITY): Payer: Self-pay

## 2021-05-19 ENCOUNTER — Ambulatory Visit (HOSPITAL_COMMUNITY)
Admission: EM | Admit: 2021-05-19 | Discharge: 2021-05-19 | Disposition: A | Discharge status: Home / Self Care | Payer: 59 | Attending: Family Medicine | Admitting: Family Medicine

## 2021-05-19 ENCOUNTER — Other Ambulatory Visit: Payer: Self-pay

## 2021-05-19 DIAGNOSIS — R059 Cough, unspecified: Secondary | ICD-10-CM | POA: Diagnosis not present

## 2021-05-19 DIAGNOSIS — R051 Acute cough: Secondary | ICD-10-CM

## 2021-05-19 MED ORDER — BENZONATATE 100 MG PO CAPS: ORAL_CAPSULE | ORAL | 0 refills | Status: DC

## 2021-05-19 NOTE — ED Triage Notes (Signed)
Pt presents to the office today for cough and congestion x 1 week.

## 2021-05-19 NOTE — ED Provider Notes (Signed)
[facility]   [id number] 05/19/21 Arrival Time: 1311  ASSESSMENT & PLAN:  1. Acute cough    I have personally viewed the imaging studies ordered this visit. Normal CXR.  Discussed typical duration of viral illnesses and lingering cough. No wheezing. Smoker. OTC symptom care as needed. Work note provided.  Meds ordered this encounter  Medications   benzonatate (TESSALON) 100 MG capsule    Sig: Take 1 capsule by mouth every 8 (eight) hours for cough.    Dispense:  21 capsule    Refill:  0     Follow-up Information     Grayce Sessions, NP.   Specialty: Internal Medicine Why: If worsening or failing to improve as anticipated. Contact information: [id number] [place] [address] [phone]                 Reviewed expectations re: course of current medical issues. Questions answered. Outlined signs and symptoms indicating need for more acute intervention. Understanding verbalized. After Visit Summary given.   SUBJECTIVE: History from: patient. Jaime Adams is a 60 y.o. female who reports: "hot chills and cold fever" last week; feeling better; now with a dry cough; no SOB. Denies: fever. Normal PO intake without n/v/d.  Social History   Tobacco Use  Smoking Status Every Day   Packs/day: 1.00   Years: 20.00   Pack years: 20.00   Types: Cigarettes  Smokeless Tobacco Never    OBJECTIVE:  Vitals:   05/19/21 1441  BP: (!) 154/93  Pulse: (!) 102  Temp: 98.9 F (37.2 C)  TempSrc: Oral  SpO2: 100%    General appearance: alert; no distress Eyes: PERRLA; EOMI; conjunctiva norma HENT: [place]; AT; with nasal congestion Neck: supple  Lungs: speaks full sentences without difficulty; unlabored; CTAB Extremities: no edema Skin: warm and dry Neurologic: normal gait Psychological: alert and cooperative; normal mood and affect   Imaging: DG Chest 2 View  Result Date: 05/19/2021 CLINICAL DATA:  Cough for 1 week. EXAM: CHEST  - 2 VIEW COMPARISON:  05/12/2020 FINDINGS: The heart size and mediastinal contours are within normal limits. Both lungs are clear. Previously noted nodular density in the left infrahilar region is no longer visualized. No evidence of pleural effusion. Mild thoracic spine degenerative changes noted. IMPRESSION: No active cardiopulmonary disease. Electronically Signed   By: Danae Orleans M.D.   On: 05/19/2021 15:05    No Known Allergies  Past Medical History:  Diagnosis Date   High cholesterol    Hypertension    Social History   Socioeconomic History   Marital status: Married    Spouse name: Not on file   Number of children: Not on file   Years of education: Not on file   Highest education level: Not on file  Occupational History   Not on file  Tobacco Use   Smoking status: Every Day    Packs/day: 1.00    Years: 20.00    Pack years: 20.00    Types: Cigarettes   Smokeless tobacco: Never  Vaping Use   Vaping Use: Never used  Substance and Sexual Activity   Alcohol use: Yes    Alcohol/week: 0.0 standard drinks   Drug use: No   Sexual activity: Not Currently  Other Topics Concern   Not on file  Social History Narrative   Lives alone.  Rents a room.  Has 2 children.  Works at [employer].  Education: high school.   Social Determinants of Health   Financial  Resource Strain: Not on file  Food Insecurity: Not on file  Transportation Needs: Not on file  Physical Activity: Not on file  Stress: Not on file  Social Connections: Not on file  Intimate Partner Violence: Not on file   Family History  Problem Relation Age of Onset   Hypertension Mother    Alzheimer's disease Mother    Cirrhosis Father    Cancer Maternal Aunt    Heart attack Brother    Stroke Brother    Aneurysm Brother    Diabetes Brother    History reviewed. No pertinent surgical history.   Mardella Layman, MD 05/19/21 1525

## 2022-05-31 ENCOUNTER — Ambulatory Visit (INDEPENDENT_AMBULATORY_CARE_PROVIDER_SITE_OTHER): Payer: Self-pay | Admitting: Primary Care

## 2022-05-31 ENCOUNTER — Encounter (INDEPENDENT_AMBULATORY_CARE_PROVIDER_SITE_OTHER): Payer: Self-pay | Admitting: Primary Care

## 2022-05-31 VITALS — BP 146/87 | HR 95 | Resp 16 | Ht 69.0 in | Wt 128.0 lb

## 2022-05-31 DIAGNOSIS — R030 Elevated blood-pressure reading, without diagnosis of hypertension: Secondary | ICD-10-CM

## 2022-05-31 DIAGNOSIS — N390 Urinary tract infection, site not specified: Secondary | ICD-10-CM

## 2022-05-31 DIAGNOSIS — R319 Hematuria, unspecified: Secondary | ICD-10-CM

## 2022-05-31 DIAGNOSIS — E782 Mixed hyperlipidemia: Secondary | ICD-10-CM

## 2022-05-31 DIAGNOSIS — M545 Low back pain, unspecified: Secondary | ICD-10-CM

## 2022-05-31 DIAGNOSIS — E876 Hypokalemia: Secondary | ICD-10-CM

## 2022-05-31 DIAGNOSIS — R109 Unspecified abdominal pain: Secondary | ICD-10-CM

## 2022-05-31 LAB — POCT URINALYSIS DIP (CLINITEK)
Blood, UA: NEGATIVE
Glucose, UA: NEGATIVE mg/dL
Nitrite, UA: NEGATIVE
POC PROTEIN,UA: 30 — AB
Spec Grav, UA: 1.025 (ref 1.010–1.025)
Urobilinogen, UA: 1 E.U./dL
pH, UA: 6 (ref 5.0–8.0)

## 2022-05-31 MED ORDER — PHENAZOPYRIDINE HCL 100 MG PO TABS: 100.0000 mg | ORAL_TABLET | Freq: Three times a day (TID) | ORAL | 0 refills | Status: DC | PRN

## 2022-05-31 NOTE — Progress Notes (Signed)
[facility]  Subjective: CC: back pain PCP: Grayce Sessions, NP HPI: Patient is a 61 y.o. female presenting to clinic today for back pain. Concerns today include:  1. Back Pain Patient reports that pain began 2-3 months ago.  No h/o back pain.  Pain is a 4/10.  It no radiate.  Standing for periods of time worsens pain.  Off her feet  improves pain.  Patient has been taking Goody powder for pain with some relief.  Patient denies trauma or injury.  Denies  dysuria, hematuria, fevers, chills, nausea, vomiting, abdominal pain, renal stones. Aggravating factors: certain movements and prolonged walking/standing. Alleviating factors: rest. Progressive LE weakness or saddle anesthesia: none. Extremity sensation changes or weakness: none. Ambulatory without difficulty. Normal bowel/bladder habits: yes; without urinary retention. Normal PO intake without n/v. No associated abdominal pain/n/v. Self treatment: has OTC analgesics, with minimal relief. Patient denies: urinary retention/incontinence, bowel incontinence, weakness, falls, sensation changes or pain anywhere else. No h/o back surgeries.    Current Outpatient Medications:    aspirin EC 81 MG tablet, Take 81 mg by mouth daily. Swallow whole., Disp: , Rfl:    atorvastatin (LIPITOR) 20 MG tablet, Take 1 tablet (20 mg total) by mouth daily., Disp: 90 tablet, Rfl: 3   benzonatate (TESSALON) 100 MG capsule, Take 1 capsule by mouth every 8 (eight) hours for cough. (Patient not taking: Reported on 05/31/2022), Disp: 21 capsule, Rfl: 0   potassium chloride SA (KLOR-CON) 10 MEQ tablet, Take 1 tablet (10 mEq total) by mouth once for 1 dose., Disp: 60 tablet, Rfl: 1 No Known Allergies  Past Medical History:  Diagnosis Date   High cholesterol    Hypertension    Social History   Socioeconomic History   Marital status: Married    Spouse name: Not on file   Number of children: Not on file   Years of education: Not on file    Highest education level: Not on file  Occupational History   Not on file  Tobacco Use   Smoking status: Every Day    Packs/day: 1.00    Years: 20.00    Total pack years: 20.00    Types: Cigarettes   Smokeless tobacco: Never  Vaping Use   Vaping Use: Never used  Substance and Sexual Activity   Alcohol use: Yes    Alcohol/week: 0.0 standard drinks of alcohol   Drug use: No   Sexual activity: Not Currently  Other Topics Concern   Not on file  Social History Narrative   Lives alone.  Rents a room.  Has 2 children.  Works at [employer].  Education: high school.   Social Determinants of Health   Financial Resource Strain: Not on file  Food Insecurity: Not on file  Transportation Needs: Not on file  Physical Activity: Not on file  Stress: Not on file  Social Connections: Not on file  Intimate Partner Violence: Not on file   No past surgical history on file.  ROS: per HPI  Objective: Office vital signs reviewed. Blood Pressure (Abnormal) 146/87   Pulse 95   Respiration 16   Height 5\' 9"  (1.753 m)   Weight 128 lb (58.1 kg)   Oxygen Saturation 100%   Body Mass Index 18.90 kg/m   Physical Examination:  General: No apparent distress. Eyes: Extraocular eye movements intact, pupils equal and round. Neck: Supple, trachea midline. Thyroid: No enlargement, mobile without fixation, no tenderness. Cardiovascular: Regular rhythm and rate, no murmur, normal radial  pulses. Respiratory: Normal respiratory effort, clear to auscultation. Gastrointestinal: Normal pitch active bowel sounds, nontender abdomen without distention or appreciable hepatomegaly. Neurologic: Cranial nerves normal as tested, deep tendon Musculoskeletal: Normal muscle tone, no tenderness on palpation of tibia, no excessive thoracic kyphosis. Skin: Appropriate warmth, no visible rash. Mental status: Alert, conversant, speech clear, thought logical, appropriate mood and affect, no hallucinations or delusions  evident. Hematologic/lymphatic: No cervical adenopathy, no visible ecchymoses.  Assessment/ Plan: [name] was seen today for back pain.  Diagnoses and all orders for this visit:  Mixed hyperlipidemia -     Lipid panel; Future -     CBC with Differential/Platelet; Future  Hypokalemia -     Comprehensive metabolic panel; Future -     CBC with Differential/Platelet; Future  Bilateral low back pain, unspecified chronicity, unspecified whether sciatica present  Elevated blood pressure reading in office without diagnosis of hypertension  Flank pain, acute -     POCT URINALYSIS DIP (CLINITEK) -     phenazopyridine (PYRIDIUM) 100 MG tablet; Take 1 tablet (100 mg total) by mouth 3 (three) times daily as needed for pain. -     Urine Culture  Urinary tract infection with hematuria, site unspecified -     phenazopyridine (PYRIDIUM) 100 MG tablet; Take 1 tablet (100 mg total) by mouth 3 (three) times daily as needed for pain. -     Urine Culture    The above assessment and management plan was discussed with the patient. The patient verbalized understanding of and has agreed to the management plan. Patient is aware to call the clinic if symptoms persist or worsen. Patient is aware when to return to the clinic for a follow-up visit. Patient educated on when it is appropriate to go to the emergency department.   This note has been created with Education officer, environmental. Any transcriptional errors are unintentional.   Grayce Sessions, NP 05/31/2022, 11:23 AM

## 2022-05-31 NOTE — Patient Instructions (Signed)
Flank Pain, Adult Flank pain is pain in your side. The flank is the area on your side between your upper belly (abdomen) and your spine. The pain may occur over a short time (acute), or it may be long-term or come back often (chronic). It may be mild or very bad. Pain in this area can be caused by many different things. Follow these instructions at home:  Drink enough fluid to keep your pee (urine) pale yellow. Rest as told by your doctor. Take over-the-counter and prescription medicines only as told by your doctor. Keep a journal to keep track of: What has caused your flank pain. What has made your flank pain feel better. Keep all follow-up visits. Contact a doctor if: Medicine does not help your pain. You have new symptoms. Your pain gets worse. Your symptoms last longer than 2-3 days. You have trouble peeing. You are peeing more often than normal. Get help right away if: You have trouble breathing. You are short of breath. Your belly hurts, or it is swollen or red. You feel like you may vomit (nauseous). You vomit. You feel faint, or you faint. You have blood in your pee. You have flank pain and a fever. These symptoms may be an emergency. Get help right away. Call your local emergency services (911 in the [place] Do not wait to see if the symptoms will go away. Do not drive yourself to the hospital. Summary Flank pain is pain in your side. The flank is the area of your side between your upper belly (abdomen) and your spine. Flank pain may occur over a short time (acute), or it may be long-term or come back often (chronic). It may be mild or very bad. Pain in this area can be caused by many different things. Contact your doctor if your symptoms get worse or last longer than 2-3 days. This information is not intended to replace advice given to you by your health care provider. Make sure you discuss any questions you have with your health care provider. Document Revised:  08/18/2020 Document Reviewed: 08/18/2020 Elsevier Patient Education  2023 Elsevier Inc. Preventing Hypertension Hypertension, also called high blood pressure, is when the force of blood pumping through the arteries is too strong. Arteries are blood vessels that carry blood from the heart throughout the body. Often, hypertension does not cause symptoms until blood pressure is very high. It is important to have your blood pressure checked regularly. Diet and lifestyle changes can help you prevent hypertension, and they may make you feel better overall and improve your quality of life. If you already have hypertension, you may control it with diet and lifestyle changes, as well as with medicine. How can this condition affect me? Over time, hypertension can damage the arteries and decrease blood flow to important parts of the body, including the brain, heart, and kidneys. By keeping your blood pressure in a healthy range, you can help prevent complications like heart attack, heart failure, stroke, kidney failure, and vascular dementia. What can increase my risk? An unhealthy diet and a lack of physical activity can make you more likely to develop high blood pressure. Some other risk factors include: Age. The risk increases with age. Having family members who have had high blood pressure. Having certain health conditions, such as thyroid problems. Being overweight or obese. Drinking too much alcohol or caffeine. Having too much fat, sugar, calories, or salt (sodium) in your diet. Smoking or using illegal drugs. Taking certain medicines, such as antidepressants,  decongestants, birth control pills, and NSAIDs, such as ibuprofen. What actions can I take to prevent or manage this condition? Work with your health care provider to make a hypertension prevention plan that works for you. You may be referred for counseling on a healthy diet and physical activity. Follow your plan and keep all follow-up  visits. Diet changes Maintain a healthy diet. This includes: Eating less salt (sodium). Ask your health care provider how much sodium is safe for you to have. The general recommendation is to have less than 1 tsp (2,300 mg) of sodium a day. Do not add salt to your food. Choose low-sodium options when grocery shopping and eating out. Limiting fats in your diet. You can do this by eating low-fat or fat-free dairy products and by eating less red meat. Eating more fruits, vegetables, and whole grains. Make a goal to eat: 1-2 cups of fresh fruits and vegetables each day. 3-4 servings of whole grains each day. Avoiding foods and beverages that have added sugars. Eating fish that contain healthy fats (omega-3 fatty acids), such as mackerel or salmon. If you need help putting together a healthy eating plan, try the DASH diet. This diet is high in fruits, vegetables, and whole grains. It is low in sodium, red meat, and added sugars. DASH stands for Dietary Approaches to Stop Hypertension. Lifestyle changes  Lose weight if you are overweight. Losing just 3-5% of your body weight can help prevent or control hypertension. For example, if your present weight is 200 lb (91 kg), a loss of 3-5% of your weight means losing 6-10 lb (2.7-4.5 kg). Ask your health care provider to help you with a diet and exercise plan to safely lose weight. Get enough exercise. Do at least 150 minutes of moderate-intensity exercise each week. You could do this in short exercise sessions several times a day, or you could do longer exercise sessions a few times a week. For example, you could take a brisk 10-minute walk or bike ride, 3 times a day, for 5 days a week. Find ways to reduce stress, such as exercising, meditating, listening to music, or taking a yoga class. If you need help reducing stress, ask your health care provider. Do not use any products that contain nicotine or tobacco. These products include cigarettes, chewing  tobacco, and vaping devices, such as e-cigarettes. Chemicals in tobacco and nicotine products raise your blood pressure each time you use them. If you need help quitting, ask your health care provider. Learn how to check your blood pressure at home. Make sure that you know your personal target blood pressure, as told by your health care provider. Try to sleep 7-9 hours per night. Alcohol use Do not drink alcohol if: Your health care provider tells you not to drink. You are pregnant, may be pregnant, or are planning to become pregnant. If you drink alcohol: Limit how much you have to: 0-1 drink a day for women. 0-2 drinks a day for men. Know how much alcohol is in your drink. In the [place] one drink equals one 12 oz bottle of beer (355 mL), one 5 oz glass of wine (148 mL), or one 1 oz glass of hard liquor (44 mL). Medicines In addition to diet and lifestyle changes, your health care provider may recommend medicines to help lower your blood pressure. In general: You may need to try a few different medicines to find what works best for you. You may need to take more than one medicine. Take  over-the-counter and prescription medicines only as told by your health care provider. Questions to ask your health care provider What is my blood pressure goal? How can I lower my risk for high blood pressure? How should I monitor my blood pressure at home? Where to find support Your health care provider can help you prevent hypertension and help you keep your blood pressure at a healthy level. Your local hospital or your community may also provide support services and prevention programs. The American Heart Association offers an online support network at supportnetwork.heart.org Where to find more information Learn more about hypertension from: [place], Lung, and [place]: https://wilson-eaton.com/ Centers for Disease Control and Prevention: http://www.wolf.info/ American Academy of Family Physicians:  familydoctor.org Learn more about the DASH diet from: [place], Lung, and [place]: https://wilson-eaton.com/ Contact a health care provider if: You think you are having a reaction to medicines you have taken. You have recurrent headaches or feel dizzy. You have swelling in your ankles. You have trouble with your vision. Get help right away if: You have sudden, severe chest, back, or abdominal pain or discomfort. You have shortness of breath. You have a sudden, severe headache. These symptoms may be an emergency. Get help right away. Call 911. Do not wait to see if the symptoms will go away. Do not drive yourself to the hospital. Summary Hypertension often does not cause any symptoms until blood pressure is very high. It is important to get your blood pressure checked regularly. Diet and lifestyle changes are important steps in preventing hypertension. By keeping your blood pressure in a healthy range, you may prevent complications like heart attack, heart failure, stroke, and kidney failure. Work with your health care provider to make a hypertension prevention plan that works for you. This information is not intended to replace advice given to you by your health care provider. Make sure you discuss any questions you have with your health care provider. Document Revised: 03/26/2021 Document Reviewed: 03/26/2021 Elsevier Patient Education  [place].

## 2022-06-02 LAB — URINE CULTURE

## 2022-06-16 ENCOUNTER — Ambulatory Visit (INDEPENDENT_AMBULATORY_CARE_PROVIDER_SITE_OTHER): Payer: Commercial Managed Care - HMO | Admitting: Primary Care

## 2022-06-16 ENCOUNTER — Encounter (INDEPENDENT_AMBULATORY_CARE_PROVIDER_SITE_OTHER): Payer: Self-pay | Admitting: Primary Care

## 2022-06-16 VITALS — BP 160/92 | HR 79 | Temp 98.6°F | Resp 16 | Ht 69.0 in | Wt 123.0 lb

## 2022-06-16 DIAGNOSIS — Z1231 Encounter for screening mammogram for malignant neoplasm of breast: Secondary | ICD-10-CM | POA: Diagnosis not present

## 2022-06-16 DIAGNOSIS — I10 Essential (primary) hypertension: Secondary | ICD-10-CM

## 2022-06-16 DIAGNOSIS — E782 Mixed hyperlipidemia: Secondary | ICD-10-CM

## 2022-06-16 DIAGNOSIS — E876 Hypokalemia: Secondary | ICD-10-CM

## 2022-06-16 DIAGNOSIS — Z1211 Encounter for screening for malignant neoplasm of colon: Secondary | ICD-10-CM

## 2022-06-16 DIAGNOSIS — E559 Vitamin D deficiency, unspecified: Secondary | ICD-10-CM

## 2022-06-16 DIAGNOSIS — Z0000 Encounter for general adult medical examination without abnormal findings: Secondary | ICD-10-CM | POA: Diagnosis not present

## 2022-06-16 DIAGNOSIS — Z720 Tobacco use: Secondary | ICD-10-CM

## 2022-06-16 MED ORDER — VALSARTAN-HYDROCHLOROTHIAZIDE 160-25 MG PO TABS: 1.0000 | ORAL_TABLET | Freq: Every day | ORAL | 3 refills | Status: DC

## 2022-06-16 NOTE — Progress Notes (Signed)
Physical exam Vitamin D concern

## 2022-06-16 NOTE — Progress Notes (Addendum)
Jaime Adams is a 61 y.o. female presents to office today for annual physical exam examination.    Concerns today include: 1. Hypertension on both sides of her family   Occupation: food service , Marital status: W, Substance use: N Diet: none , Exercise: walking   Health Maintenance  Topic Date Due   COVID-19 Vaccine (1) Never done   COLONOSCOPY (Pts 45-37yrs Insurance coverage will need to be confirmed)  Never done   Zoster Vaccines- Shingrix (1 of 2) Never done   Lung Cancer Screening  09/02/2010   MAMMOGRAM  01/23/2012   INFLUENZA VACCINE  09/19/2022 (Originally 01/19/2022)   PAP SMEAR-Modifier  07/25/2023   DTaP/Tdap/Td (2 - Td or Tdap) 05/23/2030   Hepatitis C Screening  Completed   HIV Screening  Completed   HPV VACCINES  Aged Out     Past Medical History:  Diagnosis Date   High cholesterol    Hypertension    Social History   Socioeconomic History   Marital status: Married    Spouse name: Not on file   Number of children: Not on file   Years of education: Not on file   Highest education level: Not on file  Occupational History   Not on file  Tobacco Use   Smoking status: Every Day    Packs/day: 1.00    Years: 20.00    Total pack years: 20.00    Types: Cigarettes   Smokeless tobacco: Never  Vaping Use   Vaping Use: Never used  Substance and Sexual Activity   Alcohol use: Yes    Alcohol/week: 0.0 standard drinks of alcohol   Drug use: No   Sexual activity: Not Currently  Other Topics Concern   Not on file  Social History Narrative   Lives alone.  Rents a room.  Has 2 children.  Works at [employer].  Education: high school.   Social Determinants of Health   Financial Resource Strain: Not on file  Food Insecurity: Not on file  Transportation Needs: Not on file  Physical Activity: Not on file  Stress: Not on file  Social Connections: Not on file  Intimate Partner Violence: Not on file   No past surgical  history on file. Family History  Problem Relation Age of Onset   Hypertension Mother    Alzheimer's disease Mother    Cirrhosis Father    Cancer Maternal Aunt    Heart attack Brother    Stroke Brother    Aneurysm Brother    Diabetes Brother     Current Outpatient Medications:    aspirin EC 81 MG tablet, Take 81 mg by mouth daily. Swallow whole., Disp: , Rfl:    atorvastatin (LIPITOR) 20 MG tablet, Take 1 tablet (20 mg total) by mouth daily. (Patient not taking: Reported on 06/16/2022), Disp: 90 tablet, Rfl: 3   potassium chloride SA (KLOR-CON) 10 MEQ tablet, Take 1 tablet (10 mEq total) by mouth once for 1 dose., Disp: 60 tablet, Rfl: 1 Outpatient Encounter Medications as of 06/16/2022  Medication Sig   aspirin EC 81 MG tablet Take 81 mg by mouth daily. Swallow whole.   atorvastatin (LIPITOR) 20 MG tablet Take 1 tablet (20 mg total) by mouth daily. (Patient not taking: Reported on 06/16/2022)   potassium chloride SA (KLOR-CON) 10 MEQ tablet Take 1 tablet (10 mEq total) by mouth once for 1 dose.   [DISCONTINUED] benzonatate (TESSALON) 100 MG capsule Take 1 capsule by mouth every 8 (eight)  hours for cough. (Patient not taking: Reported on 05/31/2022)   [DISCONTINUED] phenazopyridine (PYRIDIUM) 100 MG tablet Take 1 tablet (100 mg total) by mouth 3 (three) times daily as needed for pain.   No facility-administered encounter medications on file as of 06/16/2022.    No Known Allergies   ROS: Review of Systems Pertinent items noted in HPI and remainder of comprehensive ROS otherwise negative.    Physical exam Blood Pressure (Abnormal) 184/90 (BP Location: Left Arm, Patient Position: Sitting, Cuff Size: Normal)   Pulse 79   Temperature 98.6 F (37 C) (Oral)   Respiration 16   Height 5\' 9"  (1.753 m)   Weight 123 lb (55.8 kg)   Body Mass Index 18.16 kg/m  General appearance: alert, cooperative, and no distress thin Head: Normocephalic, without obvious abnormality, atraumatic Eyes:  conjunctivae/corneas clear. PERRL, EOM's intact. Fundi benign. Ears: normal TM's and external ear canals both ears Nose: Nares normal. Septum midline. Mucosa normal. No drainage or sinus tenderness. Neck: no adenopathy, no carotid bruit, no JVD, supple, symmetrical, trachea midline, and thyroid not enlarged, symmetric, no tenderness/mass/nodules Back: symmetric, no curvature. ROM normal. No CVA tenderness. Lungs: clear to auscultation bilaterally Heart: regular rate and rhythm, S1, S2 normal, grade 2  murmur, no click, rub or gallop Abdomen: soft, non-tender; bowel sounds normal; no masses,  no organomegaly Extremities: extremities normal, atraumatic, no cyanosis or edema Skin: Skin color, texture, turgor normal. No rashes or lesions Lymph nodes: Cervical, supraclavicular, and axillary nodes normal.    Assessment/ Plan: Jaime Adams here for annual physical exam.  [name] was seen today for annual exam.  Diagnoses and all orders for this visit:  Breast cancer screening by mammogram -     MM Digital Screening; Future  Annual physical exam  Essential hypertension  BP goal - < 140/90 Explained that having normal blood pressure is the goal and medications are helping to get to goal and maintain normal blood pressure. DIET: Limit salt intake, read nutrition labels to check salt content, limit fried and high fatty foods  Avoid using multisymptom OTC cold preparations that generally contain sudafed which can rise BP. Consult with pharmacist on best cold relief products to use for persons with HTN EXERCISE Discussed incorporating exercise such as walking - 30 minutes most days of the week and can do in 10 minute intervals   Take all medication as prescribed. Avoid smoked meats which are high in sodium content. Avoid soda which contains sodium and are high in sugar which increases your risk for diabetes.  Colon cancer screening -     Cologuard  Tobacco abuse - I have recommended  complete cessation of tobacco use. I have discussed various options available for assistance with tobacco cessation including over the counter methods (Nicotine gum, patch and lozenges). We also discussed prescription options (Chantix, Nicotine Inhaler / Nasal Spray). The patient is not interested in pursuing any prescription tobacco cessation options at this time. - Patient declines at this time.  - Less than 5 minutes spent on counseling.  -     CT CHEST LUNG CA SCREEN LOW DOSE W/O CM; Future  Vitamin D deficiency -     VITAMIN D 25 Hydroxy (Vit-D Deficiency, Fractures)  Other orders -     valsartan-hydrochlorothiazide (DIOVAN-HCT) 160-25 MG tablet; Take 1 tablet by mouth daily.     Counseled on healthy lifestyle choices, including diet (rich in fruits, vegetables and lean meats and low in salt and simple carbohydrates) and exercise (at least  30 minutes of moderate physical activity daily).  Patient to follow up in 1 year for annual exam or sooner if needed.  The above assessment and management plan was discussed with the patient. The patient verbalized understanding of and has agreed to the management plan. Patient is aware to call the clinic if symptoms persist or worsen. Patient is aware when to return to the clinic for a follow-up visit. Patient educated on when it is appropriate to go to the emergency department.   This note has been created with Education officer, environmental. Any transcriptional errors are unintentional.   Grayce Sessions, NP 06/16/2022, 10:27 AM

## 2022-06-17 ENCOUNTER — Other Ambulatory Visit (INDEPENDENT_AMBULATORY_CARE_PROVIDER_SITE_OTHER): Payer: Self-pay | Admitting: Primary Care

## 2022-06-17 LAB — COMPREHENSIVE METABOLIC PANEL
ALT: 13 IU/L (ref 0–32)
AST: 41 IU/L — ABNORMAL HIGH (ref 0–40)
Albumin/Globulin Ratio: 1.4 (ref 1.2–2.2)
Albumin: 4.9 g/dL (ref 3.9–4.9)
Alkaline Phosphatase: 138 IU/L — ABNORMAL HIGH (ref 44–121)
BUN/Creatinine Ratio: 14 (ref 12–28)
BUN: 10 mg/dL (ref 8–27)
Bilirubin Total: 0.9 mg/dL (ref 0.0–1.2)
CO2: 23 mmol/L (ref 20–29)
Calcium: 10.2 mg/dL (ref 8.7–10.3)
Chloride: 100 mmol/L (ref 96–106)
Creatinine, Ser: 0.74 mg/dL (ref 0.57–1.00)
Globulin, Total: 3.6 g/dL (ref 1.5–4.5)
Glucose: 100 mg/dL — ABNORMAL HIGH (ref 70–99)
Potassium: 3.8 mmol/L (ref 3.5–5.2)
Sodium: 142 mmol/L (ref 134–144)
Total Protein: 8.5 g/dL (ref 6.0–8.5)
eGFR: 91 mL/min/{1.73_m2} (ref >59)

## 2022-06-17 LAB — CBC WITH DIFFERENTIAL/PLATELET

## 2022-06-17 LAB — LIPID PANEL
Chol/HDL Ratio: 2.5 ratio (ref 0.0–4.4)
Cholesterol, Total: 290 mg/dL — ABNORMAL HIGH (ref 100–199)
HDL: 117 mg/dL (ref >39)
LDL Chol Calc (NIH): 160 mg/dL — ABNORMAL HIGH (ref 0–99)
Triglycerides: 85 mg/dL (ref 0–149)
VLDL Cholesterol Cal: 13 mg/dL (ref 5–40)

## 2022-06-17 LAB — VITAMIN D 25 HYDROXY (VIT D DEFICIENCY, FRACTURES): Vit D, 25-Hydroxy: <4 ng/mL — ABNORMAL LOW (ref 30.0–100.0)

## 2022-06-17 MED ORDER — ERGOCALCIFEROL 1.25 MG (50000 UT) PO CAPS: 50000.0000 [IU] | ORAL_CAPSULE | ORAL | 0 refills | Status: DC

## 2022-07-07 ENCOUNTER — Encounter (HOSPITAL_COMMUNITY): Payer: Self-pay | Admitting: *Deleted

## 2022-07-07 ENCOUNTER — Other Ambulatory Visit: Payer: Self-pay

## 2022-07-07 ENCOUNTER — Ambulatory Visit (INDEPENDENT_AMBULATORY_CARE_PROVIDER_SITE_OTHER): Payer: Commercial Managed Care - HMO

## 2022-07-07 ENCOUNTER — Encounter (HOSPITAL_COMMUNITY): Payer: Self-pay

## 2022-07-07 ENCOUNTER — Observation Stay (HOSPITAL_COMMUNITY)
Admission: EM | Admit: 2022-07-07 | Discharge: 2022-07-09 | Disposition: A | Discharge status: Home / Self Care | Payer: Commercial Managed Care - HMO | Attending: Family Medicine | Admitting: Family Medicine

## 2022-07-07 ENCOUNTER — Telehealth (HOSPITAL_COMMUNITY): Payer: Self-pay | Admitting: Emergency Medicine

## 2022-07-07 ENCOUNTER — Ambulatory Visit (INDEPENDENT_AMBULATORY_CARE_PROVIDER_SITE_OTHER)
Admission: EM | Admit: 2022-07-07 | Discharge: 2022-07-07 | Disposition: A | Discharge status: Home / Self Care | Payer: Commercial Managed Care - HMO | Source: Home / Self Care | Attending: Family Medicine | Admitting: Family Medicine

## 2022-07-07 DIAGNOSIS — E871 Hypo-osmolality and hyponatremia: Secondary | ICD-10-CM

## 2022-07-07 DIAGNOSIS — Z7982 Long term (current) use of aspirin: Secondary | ICD-10-CM | POA: Diagnosis not present

## 2022-07-07 DIAGNOSIS — E781 Pure hyperglyceridemia: Secondary | ICD-10-CM | POA: Insufficient documentation

## 2022-07-07 DIAGNOSIS — I959 Hypotension, unspecified: Secondary | ICD-10-CM | POA: Insufficient documentation

## 2022-07-07 DIAGNOSIS — N179 Acute kidney failure, unspecified: Principal | ICD-10-CM | POA: Diagnosis present

## 2022-07-07 DIAGNOSIS — F17210 Nicotine dependence, cigarettes, uncomplicated: Secondary | ICD-10-CM | POA: Insufficient documentation

## 2022-07-07 DIAGNOSIS — R051 Acute cough: Secondary | ICD-10-CM

## 2022-07-07 DIAGNOSIS — R059 Cough, unspecified: Secondary | ICD-10-CM | POA: Diagnosis not present

## 2022-07-07 DIAGNOSIS — E860 Dehydration: Secondary | ICD-10-CM | POA: Diagnosis present

## 2022-07-07 DIAGNOSIS — Z79899 Other long term (current) drug therapy: Secondary | ICD-10-CM | POA: Insufficient documentation

## 2022-07-07 DIAGNOSIS — R5383 Other fatigue: Secondary | ICD-10-CM | POA: Insufficient documentation

## 2022-07-07 DIAGNOSIS — I10 Essential (primary) hypertension: Secondary | ICD-10-CM

## 2022-07-07 DIAGNOSIS — R531 Weakness: Secondary | ICD-10-CM | POA: Diagnosis present

## 2022-07-07 DIAGNOSIS — E875 Hyperkalemia: Secondary | ICD-10-CM | POA: Diagnosis not present

## 2022-07-07 LAB — POCT URINALYSIS DIPSTICK, ED / UC
Glucose, UA: NEGATIVE mg/dL
Hgb urine dipstick: NEGATIVE
Ketones, ur: 15 mg/dL — AB
Nitrite: NEGATIVE
Protein, ur: 30 mg/dL — AB
Specific Gravity, Urine: 1.02 (ref 1.005–1.030)
Urobilinogen, UA: 1 mg/dL (ref 0.0–1.0)
pH: 5.5 (ref 5.0–8.0)

## 2022-07-07 LAB — BASIC METABOLIC PANEL
Anion gap: 13 (ref 5–15)
BUN: 30 mg/dL — ABNORMAL HIGH (ref 8–23)
CO2: 20 mmol/L — ABNORMAL LOW (ref 22–32)
Calcium: 9.4 mg/dL (ref 8.9–10.3)
Chloride: 90 mmol/L — ABNORMAL LOW (ref 98–111)
Creatinine, Ser: 2.12 mg/dL — ABNORMAL HIGH (ref 0.44–1.00)
GFR, Estimated: 26 mL/min — ABNORMAL LOW (ref >60)
Glucose, Bld: 92 mg/dL (ref 70–99)
Potassium: 5 mmol/L (ref 3.5–5.1)
Sodium: 123 mmol/L — ABNORMAL LOW (ref 135–145)

## 2022-07-07 LAB — COMPREHENSIVE METABOLIC PANEL
ALT: 13 U/L (ref 0–44)
AST: 36 U/L (ref 15–41)
Albumin: 4.6 g/dL (ref 3.5–5.0)
Alkaline Phosphatase: 92 U/L (ref 38–126)
Anion gap: 17 — ABNORMAL HIGH (ref 5–15)
BUN: 29 mg/dL — ABNORMAL HIGH (ref 8–23)
CO2: 23 mmol/L (ref 22–32)
Calcium: 10.1 mg/dL (ref 8.9–10.3)
Chloride: 86 mmol/L — ABNORMAL LOW (ref 98–111)
Creatinine, Ser: 2.79 mg/dL — ABNORMAL HIGH (ref 0.44–1.00)
GFR, Estimated: 19 mL/min — ABNORMAL LOW (ref >60)
Glucose, Bld: 87 mg/dL (ref 70–99)
Potassium: 5.2 mmol/L — ABNORMAL HIGH (ref 3.5–5.1)
Sodium: 126 mmol/L — ABNORMAL LOW (ref 135–145)
Total Bilirubin: 1.2 mg/dL (ref 0.3–1.2)
Total Protein: 8.8 g/dL — ABNORMAL HIGH (ref 6.5–8.1)

## 2022-07-07 LAB — CBC WITH DIFFERENTIAL/PLATELET
Abs Immature Granulocytes: 0.03 10*3/uL (ref 0.00–0.07)
Basophils Absolute: 0 10*3/uL (ref 0.0–0.1)
Basophils Relative: 0 %
Eosinophils Absolute: 0 10*3/uL (ref 0.0–0.5)
Eosinophils Relative: 0 %
HCT: 39.4 % (ref 36.0–46.0)
Hemoglobin: 14 g/dL (ref 12.0–15.0)
Immature Granulocytes: 0 %
Lymphocytes Relative: 15 %
Lymphs Abs: 1.4 10*3/uL (ref 0.7–4.0)
MCH: 33.8 pg (ref 26.0–34.0)
MCHC: 35.5 g/dL (ref 30.0–36.0)
MCV: 95.2 fL (ref 80.0–100.0)
Monocytes Absolute: 1 10*3/uL (ref 0.1–1.0)
Monocytes Relative: 10 %
Neutro Abs: 6.7 10*3/uL (ref 1.7–7.7)
Neutrophils Relative %: 75 %
Platelets: 253 10*3/uL (ref 150–400)
RBC: 4.14 MIL/uL (ref 3.87–5.11)
RDW: 12.6 % (ref 11.5–15.5)
WBC: 9.2 10*3/uL (ref 4.0–10.5)
nRBC: 0 % (ref 0.0–0.2)

## 2022-07-07 LAB — POC INFLUENZA A AND B ANTIGEN (URGENT CARE ONLY)
INFLUENZA A ANTIGEN, POC: NEGATIVE
INFLUENZA B ANTIGEN, POC: NEGATIVE

## 2022-07-07 LAB — LIPASE, BLOOD: Lipase: 55 U/L — ABNORMAL HIGH (ref 11–51)

## 2022-07-07 MED ORDER — SODIUM CHLORIDE 0.9 % IV BOLUS
1000.0000 mL | Freq: Once | INTRAVENOUS | Status: AC
  Administered 2022-07-07: 1000 mL via INTRAVENOUS

## 2022-07-07 NOTE — Discharge Instructions (Addendum)
I'm suspicious your symptoms may be signs of a viral respiratory infection. If so, you could feel bad for the next several days. Your influenza test was negative. Concentrate on fluid intake and rest.  You have had labs (blood tests and a urine culture) sent today. We will call you with any significant abnormalities or if there is need to begin or change treatment or pursue further follow up.  You may also review your test results online through [facility]. If you do not have a MyChart account, instructions to sign up should be on your discharge paperwork.

## 2022-07-07 NOTE — ED Triage Notes (Signed)
Pt c/o cough, weakness, dizziness, body aches, fevers, and chills since Sunday. Denies taking any meds. States having pain to lt upper pain to sit up.

## 2022-07-07 NOTE — ED Provider Triage Note (Signed)
Emergency Medicine Provider Triage Evaluation Note  Jaime Adams , a 62 y.o. female  was evaluated in triage.  Pt complains of generalized fatigue for the past 2 days.  Reports occasional lightheadedness and body aches prior to going to UC.  Patient received fluids at [facility] and reports improvement in symptoms.  She does not take diuretics and denies history of electrolyte imbalances.  Denies chest pain, shortness of breath, fever, nausea, vomiting, abdominal pain.   Review of Systems  Positive: As above Negative: As above  Physical Exam  BP 138/80   Pulse 73   Temp 98.3 F (36.8 C)   Resp 18   Ht 5\' 9"  (1.753 m)   Wt 55.8 kg   SpO2 100%   BMI 18.17 kg/m  Gen:   Awake, no distress   Resp:  Normal effort  MSK:   Moves extremities without difficulty  Other:    Medical Decision Making  Medically screening exam initiated at 7:42 PM.  Appropriate orders placed.  Jaime Adams was informed that the remainder of the evaluation will be completed by another provider, this initial triage assessment does not replace that evaluation, and the importance of remaining in the ED until their evaluation is complete.     Jaime Adams R, [place] 07/07/22 [phone]

## 2022-07-07 NOTE — ED Provider Notes (Signed)
[place]   [phone] 07/07/22 Arrival Time: [place] PLAN:  1. Hypotension, unspecified hypotension type   2. Acute cough   3. Other fatigue   4. Hyponatremia   5. Acute kidney injury ([place])    I have personally viewed the imaging studies ordered this visit. No acute changes on CXR.  Labs Reviewed  COMPREHENSIVE METABOLIC PANEL - Abnormal; Notable for the following components:      Result Value   Sodium 126 (*)    Potassium 5.2 (*)    Chloride 86 (*)    BUN 29 (*)    Creatinine, Ser 2.79 (*)    Total Protein 8.8 (*)    GFR, Estimated 19 (*)    Anion gap 17 (*)    All other components within normal limits  LIPASE, BLOOD - Abnormal; Notable for the following components:   Lipase 55 (*)    All other components within normal limits  POCT URINALYSIS DIPSTICK, ED / UC - Abnormal; Notable for the following components:   Bilirubin Urine MODERATE (*)    Ketones, ur 15 (*)    Protein, ur 30 (*)    Leukocytes,Ua SMALL (*)    All other components within normal limits  CBC WITH DIFFERENTIAL/PLATELET  POC INFLUENZA A AND B ANTIGEN (URGENT CARE ONLY)    Rapid influenza negative.  Pt feeling much better after 1L NS. Initial hypotension responded well to fluids. Labs were pending at time of discharge; pt prefers to wait at home.. RN will call pt now and instruct her to proceed to ED given hyponatremia, hyperkalemia, and AKI.  Meds ordered this encounter  Medications   sodium chloride 0.9 % bolus 1,000 mL   Reviewed expectations re: course of current medical issues. Questions answered. Outlined signs and symptoms indicating need for more acute intervention. Patient verbalized understanding. After Visit Summary given.   SUBJECTIVE: History from: patient.  Jaime Adams is a 62 y.o. female who presents with complaint of generalized fatigue; gradual onset; past 48 hours or so. "Just feel weak". Occasional lightheadedness. With body aches and coughing  that worsened today; non-productive. Ambulatory here. Denies CP/SOB. Also mentions lower back discomfort noted this morning. Normal bowel/bladder habits. Overall decreased PO intake the past 24 hours. Denies n/v/d. Denies abd pain. No new medications.  No tx PTA.  OBJECTIVE:  Vitals:   07/07/22 1248 07/07/22 1250 07/07/22 1337  BP:  (!) 89/57 128/82  Pulse: 100  71  Resp: 18  18  Temp: 98.1 F (36.7 C)    SpO2: 97%  96%    General appearance: alert; no distress but appears fatigued Oropharynx: slightly dry Lungs: clear to auscultation bilaterally; unlabored Heart: regular  Abdomen: soft; non-distended; no significant abdominal tenderness; bowel sounds present; no masses or organomegaly; no guarding or rebound tenderness Back: no significant CVA tenderness Extremities: no edema; symmetrical with no gross deformities Skin: warm; dry Neurologic: normal gait but slow Psychological: alert and cooperative; normal mood and affect  Labs: Results for orders placed or performed during the hospital encounter of 07/07/22  CBC with Differential/Platelet  Result Value Ref Range   WBC 9.2 4.0 - 10.5 K/uL   RBC 4.14 3.87 - 5.11 MIL/uL   Hemoglobin 14.0 12.0 - 15.0 g/dL   HCT 39.4 36.0 - 46.0 %   MCV 95.2 80.0 - 100.0 fL   MCH 33.8 26.0 - 34.0 pg   MCHC 35.5 30.0 - 36.0 g/dL   RDW 12.6 11.5 - 15.5 %  Platelets 253 150 - 400 K/uL   nRBC 0.0 0.0 - 0.2 %   Neutrophils Relative % 75 %   Neutro Abs 6.7 1.7 - 7.7 K/uL   Lymphocytes Relative 15 %   Lymphs Abs 1.4 0.7 - 4.0 K/uL   Monocytes Relative 10 %   Monocytes Absolute 1.0 0.1 - 1.0 K/uL   Eosinophils Relative 0 %   Eosinophils Absolute 0.0 0.0 - 0.5 K/uL   Basophils Relative 0 %   Basophils Absolute 0.0 0.0 - 0.1 K/uL   Immature Granulocytes 0 %   Abs Immature Granulocytes 0.03 0.00 - 0.07 K/uL  Comprehensive metabolic panel  Result Value Ref Range   Sodium 126 (L) 135 - 145 mmol/L   Potassium 5.2 (H) 3.5 - 5.1 mmol/L   Chloride  86 (L) 98 - 111 mmol/L   CO2 23 22 - 32 mmol/L   Glucose, Bld 87 70 - 99 mg/dL   BUN 29 (H) 8 - 23 mg/dL   Creatinine, Ser 2.79 (H) 0.44 - 1.00 mg/dL   Calcium 10.1 8.9 - 10.3 mg/dL   Total Protein 8.8 (H) 6.5 - 8.1 g/dL   Albumin 4.6 3.5 - 5.0 g/dL   AST 36 15 - 41 U/L   ALT 13 0 - 44 U/L   Alkaline Phosphatase 92 38 - 126 U/L   Total Bilirubin 1.2 0.3 - 1.2 mg/dL   GFR, Estimated 19 (L) >60 mL/min   Anion gap 17 (H) 5 - 15  Lipase, blood  Result Value Ref Range   Lipase 55 (H) 11 - 51 U/L  POC Influenza A & B Ag (Urgent Care)  Result Value Ref Range   INFLUENZA A ANTIGEN, POC NEGATIVE NEGATIVE   INFLUENZA B ANTIGEN, POC NEGATIVE NEGATIVE  POC Urinalysis dipstick  Result Value Ref Range   Glucose, UA NEGATIVE NEGATIVE mg/dL   Bilirubin Urine MODERATE (A) NEGATIVE   Ketones, ur 15 (A) NEGATIVE mg/dL   Specific Gravity, Urine 1.020 1.005 - 1.030   Hgb urine dipstick NEGATIVE NEGATIVE   pH 5.5 5.0 - 8.0   Protein, ur 30 (A) NEGATIVE mg/dL   Urobilinogen, UA 1.0 0.0 - 1.0 mg/dL   Nitrite NEGATIVE NEGATIVE   Leukocytes,Ua SMALL (A) NEGATIVE     Imaging: DG Chest 2 View  Result Date: 07/07/2022 CLINICAL DATA:  Cough and back pain. EXAM: CHEST - 2 VIEW COMPARISON:  Radiograph May 19, 2021 and chest CT September 01, 2009 FINDINGS: The heart size and mediastinal contours are within normal limits. No focal airspace consolidation. No pleural effusion. No pneumothorax. The visualized skeletal structures are unremarkable. IMPRESSION: No active cardiopulmonary disease. Electronically Signed   By: Dahlia Bailiff M.D.   On: 07/07/2022 14:16    No Known Allergies                                             Past Medical History:  Diagnosis Date   High cholesterol    Hypertension    Social History   Socioeconomic History   Marital status: Married    Spouse name: Not on file   Number of children: Not on file   Years of education: Not on file   Highest education level: Not on  file  Occupational History   Not on file  Tobacco Use   Smoking status: Every Day    Packs/day: 1.00  Years: 20.00    Total pack years: 20.00    Types: Cigarettes   Smokeless tobacco: Never  Vaping Use   Vaping Use: Never used  Substance and Sexual Activity   Alcohol use: Yes    Alcohol/week: 0.0 standard drinks of alcohol   Drug use: No   Sexual activity: Not Currently  Other Topics Concern   Not on file  Social History Narrative   Lives alone.  Rents a room.  Has 2 children.  Works at [employer].  Education: high school.   Social Determinants of Health   Financial Resource Strain: Not on file  Food Insecurity: Not on file  Transportation Needs: Not on file  Physical Activity: Not on file  Stress: Not on file  Social Connections: Not on file  Intimate Partner Violence: Not on file   Family History  Problem Relation Age of Onset   Hypertension Mother    Alzheimer's disease Mother    Cirrhosis Father    Cancer Maternal Aunt    Heart attack Brother    Stroke Brother    Aneurysm Brother    Diabetes Brother       Mardella Layman, MD 07/07/22 [phone]

## 2022-07-07 NOTE — ED Triage Notes (Signed)
The pt was sent here from urgent care  she has had a cough and some dizziness  she had labs at ucc and some were abnormal    no temp

## 2022-07-08 DIAGNOSIS — I10 Essential (primary) hypertension: Secondary | ICD-10-CM

## 2022-07-08 DIAGNOSIS — N179 Acute kidney failure, unspecified: Secondary | ICD-10-CM

## 2022-07-08 DIAGNOSIS — E860 Dehydration: Secondary | ICD-10-CM | POA: Diagnosis present

## 2022-07-08 DIAGNOSIS — E871 Hypo-osmolality and hyponatremia: Secondary | ICD-10-CM

## 2022-07-08 HISTORY — DX: Dehydration: E86.0

## 2022-07-08 HISTORY — DX: Hypo-osmolality and hyponatremia: E87.1

## 2022-07-08 HISTORY — DX: Acute kidney failure, unspecified: N17.9

## 2022-07-08 LAB — CBC WITH DIFFERENTIAL/PLATELET
Abs Immature Granulocytes: 0.03 10*3/uL (ref 0.00–0.07)
Basophils Absolute: 0 10*3/uL (ref 0.0–0.1)
Basophils Relative: 0 %
Eosinophils Absolute: 0 10*3/uL (ref 0.0–0.5)
Eosinophils Relative: 0 %
HCT: 36.3 % (ref 36.0–46.0)
Hemoglobin: 12.7 g/dL (ref 12.0–15.0)
Immature Granulocytes: 0 %
Lymphocytes Relative: 19 %
Lymphs Abs: 1.8 10*3/uL (ref 0.7–4.0)
MCH: 33.9 pg (ref 26.0–34.0)
MCHC: 35 g/dL (ref 30.0–36.0)
MCV: 96.8 fL (ref 80.0–100.0)
Monocytes Absolute: 0.7 10*3/uL (ref 0.1–1.0)
Monocytes Relative: 7 %
Neutro Abs: 7 10*3/uL (ref 1.7–7.7)
Neutrophils Relative %: 74 %
Platelets: 251 10*3/uL (ref 150–400)
RBC: 3.75 MIL/uL — ABNORMAL LOW (ref 3.87–5.11)
RDW: 12.3 % (ref 11.5–15.5)
WBC: 9.5 10*3/uL (ref 4.0–10.5)
nRBC: 0 % (ref 0.0–0.2)

## 2022-07-08 LAB — BASIC METABOLIC PANEL WITH GFR
Anion gap: 13 (ref 5–15)
BUN: 29 mg/dL — ABNORMAL HIGH (ref 8–23)
CO2: 22 mmol/L (ref 22–32)
Calcium: 9.6 mg/dL (ref 8.9–10.3)
Chloride: 90 mmol/L — ABNORMAL LOW (ref 98–111)
Creatinine, Ser: 1.47 mg/dL — ABNORMAL HIGH (ref 0.44–1.00)
GFR, Estimated: 40 mL/min — ABNORMAL LOW (ref >60)
Glucose, Bld: 102 mg/dL — ABNORMAL HIGH (ref 70–99)
Potassium: 4.4 mmol/L (ref 3.5–5.1)
Sodium: 125 mmol/L — ABNORMAL LOW (ref 135–145)

## 2022-07-08 MED ORDER — SODIUM CHLORIDE 0.9 % IV SOLN: INTRAVENOUS | Status: DC

## 2022-07-08 MED ORDER — ENOXAPARIN SODIUM 40 MG/0.4ML IJ SOSY: 40.0000 mg | PREFILLED_SYRINGE | INTRAMUSCULAR | Status: DC

## 2022-07-08 MED ORDER — ACETAMINOPHEN 325 MG PO TABS: 650.0000 mg | ORAL_TABLET | Freq: Four times a day (QID) | ORAL | Status: DC | PRN

## 2022-07-08 MED ORDER — ASPIRIN 81 MG PO TBEC
81.0000 mg | DELAYED_RELEASE_TABLET | Freq: Every morning | ORAL | Status: DC
  Administered 2022-07-09: 81 mg via ORAL
  Filled 2022-07-08: qty 1

## 2022-07-08 MED ORDER — SODIUM CHLORIDE 0.9 % IV BOLUS
500.0000 mL | Freq: Once | INTRAVENOUS | Status: AC
  Administered 2022-07-08: 500 mL via INTRAVENOUS

## 2022-07-08 NOTE — ED Notes (Signed)
ED TO INPATIENT HANDOFF REPORT  ED Nurse Name and Phone #: [phone]  S Name/Age/Gender Jaime Adams 62 y.o. female Room/Bed: 025C/025C  Code Status   Code Status: Full Code  Home/SNF/Other Home Patient oriented to: self, place, time, and situation Is this baseline? Yes   Triage Complete: Triage complete  Chief Complaint AKI (acute kidney injury) (HCC) [N17.9]  Triage Note The pt was sent here from urgent care  she has had a cough and some dizziness  she had labs at ucc and some were abnormal    no temp   Allergies No Known Allergies  Level of Care/Admitting Diagnosis ED Disposition     ED Disposition  Admit   Condition  --   Comment  Hospital Area: [facility] [[id number]]  Level of Care: Med-Surg [16]  May place patient in observation at [facility] or [facility] if equivalent level of care is available:: No  Covid Evaluation: Asymptomatic - no recent exposure (last 10 days) testing not required  Diagnosis: AKI (acute kidney injury) [facility]) [[id number]]  Admitting Physician: Latrelle Dodrill [phone]  Attending Physician: Latrelle Dodrill [phone]          B Medical/Surgery History Past Medical History:  Diagnosis Date   High cholesterol    Hypertension    History reviewed. No pertinent surgical history.   A IV Location/Drains/Wounds Patient Lines/Drains/Airways Status     Active Line/Drains/Airways     Name Placement date Placement time Site Days   Peripheral IV 07/08/22 20 G Right Forearm 07/08/22  1639  Forearm  less than 1            Intake/Output Last 24 hours No intake or output data in the 24 hours ending 07/08/22 1853  Labs/Imaging Results for orders placed or performed during the hospital encounter of 07/07/22 (from the past 48 hour(s))  Basic metabolic panel     Status: Abnormal   Collection Time: 07/07/22  8:07 PM  Result Value Ref Range   Sodium 123 (L) 135 - 145 mmol/L   Potassium 5.0 3.5 - 5.1 mmol/L    Chloride 90 (L) 98 - 111 mmol/L   CO2 20 (L) 22 - 32 mmol/L   Glucose, Bld 92 70 - 99 mg/dL    Comment: Glucose reference range applies only to samples taken after fasting for at least 8 hours.   BUN 30 (H) 8 - 23 mg/dL   Creatinine, Ser 1.69 (H) 0.44 - 1.00 mg/dL   Calcium 9.4 8.9 - 67.8 mg/dL   GFR, Estimated 26 (L) >60 mL/min    Comment: (NOTE) Calculated using the CKD-EPI Creatinine Equation (2021)    Anion gap 13 5 - 15    Comment: Performed at [facility], [address] [place], [place] [address]  Basic metabolic panel     Status: Abnormal   Collection Time: 07/08/22  3:51 PM  Result Value Ref Range   Sodium 125 (L) 135 - 145 mmol/L   Potassium 4.4 3.5 - 5.1 mmol/L   Chloride 90 (L) 98 - 111 mmol/L   CO2 22 22 - 32 mmol/L   Glucose, Bld 102 (H) 70 - 99 mg/dL    Comment: Glucose reference range applies only to samples taken after fasting for at least 8 hours.   BUN 29 (H) 8 - 23 mg/dL   Creatinine, Ser 1.75 (H) 0.44 - 1.00 mg/dL   Calcium 9.6 8.9 - 10.2 mg/dL   GFR, Estimated 40 (L) >  60 mL/min    Comment: (NOTE) Calculated using the CKD-EPI Creatinine Equation (2021)    Anion gap 13 5 - 15    Comment: Performed at [facility], [place] [address], [place], [place] [address]  CBC with Differential     Status: Abnormal   Collection Time: 07/08/22  3:51 PM  Result Value Ref Range   WBC 9.5 4.0 - 10.5 K/uL   RBC 3.75 (L) 3.87 - 5.11 MIL/uL   Hemoglobin 12.7 12.0 - 15.0 g/dL   HCT 36.3 36.0 - 46.0 %   MCV 96.8 80.0 - 100.0 fL   MCH 33.9 26.0 - 34.0 pg   MCHC 35.0 30.0 - 36.0 g/dL   RDW 12.3 11.5 - 15.5 %   Platelets 251 150 - 400 K/uL   nRBC 0.0 0.0 - 0.2 %   Neutrophils Relative % 74 %   Neutro Abs 7.0 1.7 - 7.7 K/uL   Lymphocytes Relative 19 %   Lymphs Abs 1.8 0.7 - 4.0 K/uL   Monocytes Relative 7 %   Monocytes Absolute 0.7 0.1 - 1.0 K/uL   Eosinophils Relative 0 %   Eosinophils Absolute 0.0 0.0 - 0.5 K/uL   Basophils Relative 0 %   Basophils Absolute  0.0 0.0 - 0.1 K/uL   Immature Granulocytes 0 %   Abs Immature Granulocytes 0.03 0.00 - 0.07 K/uL    Comment: Performed at [facility] [address], [place], [place] [address]   DG Chest 2 View  Result Date: 07/07/2022 CLINICAL DATA:  Cough and back pain. EXAM: CHEST - 2 VIEW COMPARISON:  Radiograph May 19, 2021 and chest CT September 01, 2009 FINDINGS: The heart size and mediastinal contours are within normal limits. No focal airspace consolidation. No pleural effusion. No pneumothorax. The visualized skeletal structures are unremarkable. IMPRESSION: No active cardiopulmonary disease. Electronically Signed   By: Dahlia Bailiff M.D.   On: 07/07/2022 14:16    Pending Labs Unresulted Labs (From admission, onward)     Start     Ordered   07/09/22 7371  Basic metabolic panel  Tomorrow morning,   R        07/08/22 1834   07/08/22 1830  HIV Antibody (routine testing w rflx)  (HIV Antibody (Routine testing w reflex) panel)  Once,   R        07/08/22 1834            Vitals/Pain Today's Vitals   07/08/22 1600 07/08/22 1815 07/08/22 1830 07/08/22 1845  BP: 119/67 124/82 (!) 147/79 (!) 149/83  Pulse: 72 76 71 60  Resp: 15 16 20 17   Temp:      TempSrc:      SpO2: 100% 100% 100% 98%  Weight:      Height:      PainSc:        Isolation Precautions No active isolations  Medications Medications  acetaminophen (TYLENOL) tablet 650 mg (has no administration in time range)  aspirin EC tablet 81 mg (has no administration in time range)  enoxaparin (LOVENOX) injection 40 mg (has no administration in time range)  0.9 %  sodium chloride infusion (has no administration in time range)  sodium chloride 0.9 % bolus 500 mL (500 mLs Intravenous Bolus 07/08/22 1639)    Mobility walks     Focused Assessments Renal Assessment Handoff:  New kidney issue new onset    R Recommendations: See Admitting Provider Note  Report given to:   Additional Notes:  alert and oriented walks  lives alone

## 2022-07-08 NOTE — H&P (Signed)
Hospital Admission History and Physical Service Pager: [phone]  Patient name: Jaime Adams Medical record number: [id number] Date of Birth: 1960-09-01 Age: 62 y.o. Gender: female  Primary Care Provider: Kerin Perna, NP Consultants: None Code Status: Full Preferred Emergency Contact:  Contact Information     Name Relation Home Work Mobile   Barkan,Tara Daughter [phone]        Chief Complaint: Weakness  Assessment and Plan: Jaime Adams is a 62 y.o. female presenting with fatigue, initially soft BPs and decreased appetite, found to have prerenal AKI and asymptomatic hyponatremia likely d/t decreased PO intake. Can consider home Diovan-HCTZ as cause of hyponatremia and soft blood pressure and Diovan could cause hyperkalemia as noted yesterday (now WNL).  Other differential includes viral infection although patient has not fevered and there is no leukocytosis.  She does have a cough however CXR is normal and cough is likely due to smoking.  * AKI (acute kidney injury) ([place]) Patient has likely prerenal AKI in setting of decreased p.o. intake.  Improving s/p IV fluids in urgent care yesterday in ED today. -AM BMP -NS 100 mL/h  Hyponatremia Patient is asymptomatic with sodium of 125.  Will allow correction with oral intake and NS mIVF -Recheck BMP at midnight  Hypertension holding Diovan-HCTZ in setting of soft BPs earlier, can restart if needed.   Chronic and Stable Conditions:  Nicotine patch ordered   FEN/GI: Regular diet VTE Prophylaxis: Lovenox   Disposition: med-surg, Attending Dr. Ardelia Mems  History of Present Illness:  Jaime Adams is a 62 y.o. female presenting from Urgent Care 1/17 for general complaint of feeling tired and weak and found to be hypotensive to 89/57 and given IV fluids. Labs at urgent care showed AKI, hyponatremia, and hyperkalemia. CXR done at UC negative and UA w/ moderate bilirubin, protein and 15 ketones.  Has  been waiting in the ED waiting room for 23 hours.   For past 3 days she has been fatigued. Initially felt dizzy but better after fluids at [facility]. Denies fever, abdominal pain, nausea, diarrhea and vomiting. Has had decreased apatite but has was abel to eat soup yesterday and 1/2 a burger today. Coughing up phlegm but thinks this is d/t smoking. She states she is feeling much better after receiving IV fluids in ED.   In the ED, she received 500 mL NS bolus.  Review Of Systems: Per HPI with the following additions  Pertinent Past Medical History: HTN -Diovan-HCTZ Remainder reviewed in history tab.   Pertinent Past Surgical History: No past surgical history   Remainder reviewed in history tab.   Pertinent Social History: Tobacco use: Yes 1/2 PPD, since 62 yo Alcohol use: Drinks MWF, 1/2 pint liquor  Other Substance use: No Lives alone  Pertinent Family History: Mother: HTN, Alzheimer's disease  Father: Cirrhosis  Remainder reviewed in history tab.   Important Outpatient Medications: ASA 81 mg Diovan-HCT 160-25 mg  Lipitor 20 mg (recently ordered but not yet started) Remainder reviewed in medication history.   Objective: BP (!) 149/83   Pulse 60   Temp 97.9 F (36.6 C) (Oral)   Resp 17   Ht 5\' 9"  (1.753 m)   Wt 55.8 kg   SpO2 98%   BMI 18.17 kg/m  Exam: General: Frail-appearing 62 year old female Eyes: White sclera, clear conjunctiva ENTM: MMM Neck: Supple Cardiovascular: RRR, normal S1/S2 Respiratory: CTAB, normal effort Gastrointestinal: Soft, nontender palpation, nondistended Extremities: No BLE edema Derm: Warm and dry Neuro: Alert,  no focal deficits Psych: Mood and affect appropriate for situation  Labs:  CBC BMET  Recent Labs  Lab 07/08/22 1551  WBC 9.5  HGB 12.7  HCT 36.3  PLT 251   Recent Labs  Lab 07/08/22 1551  NA 125*  K 4.4  CL 90*  CO2 22  BUN 29*  CREATININE 1.47*  GLUCOSE 102*  CALCIUM 9.6      Precious Gilding, DO 07/08/2022, 7:20  PM PGY-2, [place] Intern pager: [phone], text pages welcome Secure chat group [place]

## 2022-07-08 NOTE — ED Provider Notes (Signed)
[place] EMERGENCY DEPARTMENT Provider Note   CSN: [id number] Arrival date & time: 07/07/22  1808     History  No chief complaint on file.   Jaime Adams is a 62 y.o. female.  Pt seen yesterday at Urgent care for overall weakness and sent to ED for evaltuion because of lab abnormalities.  Pt reports she has been eating and drinking poorly.  Patient reports she has a history of hypertension however her blood pressure was low yesterday at urgent care.  Patient reports they gave her IV fluids and then advised her to come to the emergency department due to low sodium and elevated potassium and elevated renal functions.  Patient has had an extended wait in the emergency department she states that she has had  a small amount of fluids.  She has not been drinking fluids.  The history is provided by the patient. No language interpreter was used.       Home Medications Prior to Admission medications   Medication Sig Start Date End Date Taking? Authorizing Provider  aspirin EC 81 MG tablet Take 81 mg by mouth in the morning.   Yes [provider]  valsartan-hydrochlorothiazide (DIOVAN-HCT) 160-25 MG tablet Take 1 tablet by mouth daily. Patient taking differently: Take 1 tablet by mouth in the morning. 06/16/22  Yes Kerin Perna, NP  atorvastatin (LIPITOR) 20 MG tablet Take 1 tablet (20 mg total) by mouth daily. Patient not taking: Reported on 06/16/2022 07/28/20   Kerin Perna, NP  potassium chloride SA (KLOR-CON) 10 MEQ tablet Take 1 tablet (10 mEq total) by mouth once for 1 dose. Patient not taking: Reported on 07/08/2022 07/28/20 08/28/22  Kerin Perna, NP      Allergies    Patient has no known allergies.    Review of Systems   Review of Systems  All other systems reviewed and are negative.   Physical Exam Updated Vital Signs BP 119/67   Pulse 72   Temp 97.9 F (36.6 C) (Oral)   Resp 15   Ht 5\' 9"  (1.753 m)   Wt 55.8 kg    SpO2 100%   BMI 18.17 kg/m  Physical Exam Vitals and nursing note reviewed.  Constitutional:      Appearance: She is well-developed. She is ill-appearing.  HENT:     Head: Normocephalic.     Mouth/Throat:     Mouth: Mucous membranes are moist.  Eyes:     Pupils: Pupils are equal, round, and reactive to light.  Cardiovascular:     Rate and Rhythm: Normal rate and regular rhythm.  Pulmonary:     Effort: Pulmonary effort is normal.     Breath sounds: Normal breath sounds.  Abdominal:     General: Abdomen is flat. There is no distension.  Musculoskeletal:        General: Normal range of motion.     Cervical back: Normal range of motion.  Neurological:     Mental Status: She is alert and oriented to person, place, and time.  Psychiatric:        Mood and Affect: Mood normal.     ED Results / Procedures / Treatments   Labs (all labs ordered are listed, but only abnormal results are displayed) Labs Reviewed  BASIC METABOLIC PANEL - Abnormal; Notable for the following components:      Result Value   Sodium 123 (*)    Chloride 90 (*)    CO2 20 (*)  BUN 30 (*)    Creatinine, Ser 2.12 (*)    GFR, Estimated 26 (*)    All other components within normal limits  BASIC METABOLIC PANEL - Abnormal; Notable for the following components:   Sodium 125 (*)    Chloride 90 (*)    Glucose, Bld 102 (*)    BUN 29 (*)    Creatinine, Ser 1.47 (*)    GFR, Estimated 40 (*)    All other components within normal limits  CBC WITH DIFFERENTIAL/PLATELET - Abnormal; Notable for the following components:   RBC 3.75 (*)    All other components within normal limits    EKG None  Radiology DG Chest 2 View  Result Date: 07/07/2022 CLINICAL DATA:  Cough and back pain. EXAM: CHEST - 2 VIEW COMPARISON:  Radiograph May 19, 2021 and chest CT September 01, 2009 FINDINGS: The heart size and mediastinal contours are within normal limits. No focal airspace consolidation. No pleural effusion. No  pneumothorax. The visualized skeletal structures are unremarkable. IMPRESSION: No active cardiopulmonary disease. Electronically Signed   By: Dahlia Bailiff M.D.   On: 07/07/2022 14:16    Procedures Procedures    Medications Ordered in ED Medications  acetaminophen (TYLENOL) tablet 650 mg (has no administration in time range)  sodium chloride 0.9 % bolus 500 mL (500 mLs Intravenous Bolus 07/08/22 1639)    ED Course/ Medical Decision Making/ A&P Clinical Course as of 07/08/22 1729  Thu Jul 08, 2022  4580 Basic metabolic panel(!) [LS]    Clinical Course User Index [LS] Fransico Meadow, PA-C                             Medical Decision Making Patient sent to the emergency department from urgent care due to hypotension elevated potassium low sodium elevated BUN and creatinine.  Patient has not been eating or drinking well  Amount and/or Complexity of Data Reviewed External Data Reviewed: labs and notes.    Details: Notes from urgent care and labs are reviewed Labs: ordered. Decision-making details documented in ED Course.    Details: I repeated patient's lab work creatinine and GFR have improved some patient's potassium is now normal Radiology: ordered and independent interpretation performed. Decision-making details documented in ED Course.    Details: Chest x-ray is ordered reviewed and interpreted chest x-ray is normal Discussion of management or test interpretation with external provider(s): Poke with unassigned medicine to admit patient.  Family practice Medicine unassigned will admit  Risk OTC drugs. Risk Details: Goal decision making patient appears to have become dehydrated secondary to decreased oral intake.  She has had some fluid resuscitation in the emergency department however she will need admission for further IV fluids and treatment. Pt admitted            Final Clinical Impression(s) / ED Diagnoses Final diagnoses:  Acute kidney injury ([place])  Hyponatremia   Dehydration    Rx / DC Orders ED Discharge Orders     None         Sidney Ace 07/08/22 1754    Hayden Rasmussen, MD 07/09/22 1021

## 2022-07-08 NOTE — Assessment & Plan Note (Signed)
Patient has likely prerenal AKI in setting of decreased p.o. intake.  Improving s/p IV fluids in urgent care yesterday in ED today. -AM BMP -NS 100 mL/h

## 2022-07-08 NOTE — Assessment & Plan Note (Signed)
holding Diovan-HCTZ in setting of soft BPs earlier, can restart if needed.

## 2022-07-08 NOTE — Assessment & Plan Note (Signed)
Patient is asymptomatic with sodium of 125.  Will allow correction with oral intake and NS mIVF -Recheck BMP at midnight

## 2022-07-08 NOTE — Progress Notes (Signed)
FMTS Brief Progress Note  S:Went bedside to see patient with doctors [place] and [place]. Patient laying in bed comfortable, no complaints.   O: BP (!) 149/83   Pulse 60   Temp 98.7 F (37.1 C) (Oral)   Resp 17   Ht 5\' 9"  (1.753 m)   Wt 55.8 kg   SpO2 98%   BMI 18.17 kg/m   General: NAD, conversant, A&Ox4 Resp: Normal WOB  A/P: Hyponatremia Patient reports appetite and wants to eat, patient completely alert and oriented.  -Plans per day team -mIVF -Reg Diet -Midnight BMP - Orders reviewed. Labs for AM ordered, which was adjusted as needed.   AKI -mIVF -AM BMP  Holley Bouche, MD 07/08/2022, 8:19 PM PGY-2, [place] Night Resident  Please page [phone] with questions.

## 2022-07-09 ENCOUNTER — Encounter (HOSPITAL_COMMUNITY): Payer: Self-pay | Admitting: Family Medicine

## 2022-07-09 DIAGNOSIS — E559 Vitamin D deficiency, unspecified: Secondary | ICD-10-CM | POA: Insufficient documentation

## 2022-07-09 DIAGNOSIS — N179 Acute kidney failure, unspecified: Secondary | ICD-10-CM | POA: Diagnosis not present

## 2022-07-09 HISTORY — DX: Vitamin D deficiency, unspecified: E55.9

## 2022-07-09 LAB — BASIC METABOLIC PANEL
Anion gap: 10 (ref 5–15)
Anion gap: 12 (ref 5–15)
BUN: 24 mg/dL — ABNORMAL HIGH (ref 8–23)
BUN: 27 mg/dL — ABNORMAL HIGH (ref 8–23)
CO2: 24 mmol/L (ref 22–32)
CO2: 24 mmol/L (ref 22–32)
Calcium: 9.4 mg/dL (ref 8.9–10.3)
Calcium: 9.4 mg/dL (ref 8.9–10.3)
Chloride: 92 mmol/L — ABNORMAL LOW (ref 98–111)
Chloride: 93 mmol/L — ABNORMAL LOW (ref 98–111)
Creatinine, Ser: 1.16 mg/dL — ABNORMAL HIGH (ref 0.44–1.00)
Creatinine, Ser: 1.25 mg/dL — ABNORMAL HIGH (ref 0.44–1.00)
GFR, Estimated: 49 mL/min — ABNORMAL LOW (ref >60)
GFR, Estimated: 53 mL/min — ABNORMAL LOW (ref >60)
Glucose, Bld: 103 mg/dL — ABNORMAL HIGH (ref 70–99)
Glucose, Bld: 96 mg/dL (ref 70–99)
Potassium: 3.8 mmol/L (ref 3.5–5.1)
Potassium: 4 mmol/L (ref 3.5–5.1)
Sodium: 126 mmol/L — ABNORMAL LOW (ref 135–145)
Sodium: 129 mmol/L — ABNORMAL LOW (ref 135–145)

## 2022-07-09 LAB — HIV ANTIBODY (ROUTINE TESTING W REFLEX): HIV Screen 4th Generation wRfx: NONREACTIVE

## 2022-07-09 NOTE — Discharge Instructions (Addendum)
Dear Jaime Adams,   Thank you so much for allowing [place] to be part of your care!  You were admitted to [facility] for a decrease in your kidney function and we are glad you are doing better!  Please do not restart the Diovan-HCTZ as that may have contributed to the decline in your kidney function.    POST-HOSPITAL & CARE INSTRUCTIONS Please let PCP/Specialists know of any changes that were made.  Please see medications section of this packet for any medication changes.   DOCTOR'S APPOINTMENT & FOLLOW UP CARE INSTRUCTIONS  Future Appointments  Date Time Provider [place]  07/14/2022  9:10 AM Kerin Perna, NP [facility] None    RETURN PRECAUTIONS:   Take care and be well!  [facility]  [place], [place] [address] [phone]

## 2022-07-09 NOTE — Hospital Course (Addendum)
BP is a 62yo F w/ hx of HTN, hypokalemia, HLD admitted for weakness, hypotension, AKI, electrolyte abnormalities 2/2 starting Valsartan-HCTZ.   Pre-Renal AKI  Hyponatremia  Hyperkalemia  Pt presented w/ 3 day hx of weakness. She recently started Valsartan-HCTZ 05/2022. In ED, pt was found to be hypotensive, hyponatremic, hyperkalemic, and had AKI. Medications were held and pt was treated w/ IVF. Electrolytes and AKI subsequently improved. Renin labs were obtained to workup hyperaldo, will f/u outpt. Pt was discharged home and told to follow-up with PCP.   PCP Follow-up: 1) Valsartan-HCTZ was stopped. Will need outpt follow-up to monitor BP and start BP meds as tolerated. 2) Aldosterone and Renin activity labs were obtained to workup hyperaldo. Please follow-up results outpt.

## 2022-07-09 NOTE — Progress Notes (Signed)
Patient arrived on unit around 2130. Vital signs stable, patient is alert and oriented.

## 2022-07-09 NOTE — Plan of Care (Signed)

## 2022-07-09 NOTE — Discharge Summary (Addendum)
[facility] Discharge Summary  Patient name: Jaime Adams Medical record number: [id number] Date of birth: 04-Feb-1961 Age: 62 y.o. Gender: female Date of Admission: 07/07/2022  Date of Discharge: 1/19 Admitting Physician: Leeanne Rio, MD  Primary Care Provider: Kerin Perna, NP Consultants: None  Indication for Hospitalization: AKI, hypotension, electrolyte abnormalities  Brief Hospital Course:  BP is a 62yo F w/ hx of HTN, hypokalemia, HLD admitted for weakness, hypotension, AKI, electrolyte abnormalities 2/2 starting Valsartan-HCTZ.   Pre-Renal AKI  Hyponatremia  Hyperkalemia  Pt presented w/ 3 day hx of weakness. She recently started Valsartan-HCTZ 05/2022. In ED, pt was found to be hypotensive, hyponatremic, hyperkalemic, and had AKI. Medications were held and pt was treated w/ IVF. Electrolytes and AKI subsequently improved. Renin labs were obtained to workup hyperaldo, will f/u outpt. Pt was discharged home and told to follow-up with PCP.   PCP Follow-up: 1) Valsartan-HCTZ was stopped. Will need outpt follow-up to monitor BP and start BP meds as tolerated. 2) Aldosterone and Renin activity labs were obtained to workup hyperaldo. Please follow-up results outpt.   Discharge Diagnoses/Problem List:  AKI, Hyponatremia, Hyperkalemia  Disposition: Home  Discharge Condition: Improved  Discharge Exam:  Gen: Alert, pleasant, laying comfortably in bed. NAD. HEENT: NCAT. MMM. CV: RRR Resp: CTAB. Normal WOB on RA. Abm: Soft, nontender, nondistended. Normal BS.  Significant Labs and Imaging:  Recent Labs  Lab 07/07/22 1634 07/08/22 1551  WBC 9.2 9.5  HGB 14.0 12.7  HCT 39.4 36.3  PLT 253 251   Recent Labs  Lab 07/07/22 1634 07/07/22 2007 07/08/22 1551 07/08/22 2349 07/09/22 0441  NA 126* 123* 125* 126* 129*  K 5.2* 5.0 4.4 3.8 4.0  CL 86* 90* 90* 92* 93*  CO2 23 20* 22 24 24   GLUCOSE 87 92 102* 96 103*  BUN 29*  30* 29* 27* 24*  CREATININE 2.79* 2.12* 1.47* 1.25* 1.16*  CALCIUM 10.1 9.4 9.6 9.4 9.4  ALKPHOS 92  --   --   --   --   AST 36  --   --   --   --   ALT 13  --   --   --   --   ALBUMIN 4.6  --   --   --   --     Results/Tests Pending at Time of Discharge:  - Renin activity and aldosterone labs   Discharge Medications:  Allergies as of 07/09/2022   No Known Allergies      Medication List     STOP taking these medications    potassium chloride 10 MEQ tablet Commonly known as: KLOR-CON M   valsartan-hydrochlorothiazide 160-25 MG tablet Commonly known as: DIOVAN-HCT       TAKE these medications    aspirin EC 81 MG tablet Take 81 mg by mouth in the morning.   atorvastatin 20 MG tablet Commonly known as: LIPITOR Take 1 tablet (20 mg total) by mouth daily.        Discharge Instructions: Please refer to Patient Instructions section of EMR for full details.  Patient was counseled important signs and symptoms that should prompt return to medical care, changes in medications, dietary instructions, activity restrictions, and follow up appointments.   Follow-Up Appointments:  Follow-up Information     Kerin Perna, NP Follow up.   Specialty: Internal Medicine Contact information: [place] [address] [phone]  Arlyce Dice, MD 07/09/2022, 2:02 PM PGY-1, [place] Upper-Level Resident Addendum   I have independently interviewed and examined the patient. I have discussed the above with the original author and agree with their documentation. Please see also any attending notes.   Sonia Side, D.O. PGY-3, [place] [facility] 07/09/2022 2:53 PM  FPTS Service pager: [phone] (text pages welcome through [facility])

## 2022-07-12 ENCOUNTER — Telehealth (INDEPENDENT_AMBULATORY_CARE_PROVIDER_SITE_OTHER): Payer: Self-pay

## 2022-07-12 ENCOUNTER — Telehealth: Payer: Self-pay

## 2022-07-12 NOTE — Telephone Encounter (Signed)
Transition Care Management Unsuccessful Follow-up Telephone Call  Date of discharge and from where:  07/09/2022, [facility]  Attempts:  1st Attempt  Reason for unsuccessful TCM follow-up call:  Left voice message [phone], call back requested  Patient has an appointment at [facility] with Juluis Mire, NP - 07/14/2022.

## 2022-07-12 NOTE — Telephone Encounter (Signed)
Transition Care Management Unsuccessful Follow-up Telephone Call  Date of discharge and from where:  [facility] 07/09/2022  Attempts:  1st Attempt  Reason for unsuccessful TCM follow-up call:  Left voice message Juanda Crumble, [place] Direct Dial [phone]

## 2022-07-13 ENCOUNTER — Telehealth: Payer: Self-pay

## 2022-07-13 LAB — ALDOSTERONE + RENIN ACTIVITY W/ RATIO
ALDO / PRA Ratio: 1.1 (ref 0.0–30.0)
Aldosterone: 2.3 ng/dL (ref 0.0–30.0)
PRA LC/MS/MS: 2.014 ng/mL/hr (ref 0.167–5.380)

## 2022-07-13 NOTE — Telephone Encounter (Signed)
Transition Care Management Follow-up Telephone Call Date of discharge and from where: [facility] 07/09/2022 How have you been since you were released from the hospital? better Any questions or concerns? No  Items Reviewed: Did the pt receive and understand the discharge instructions provided? Yes  Medications obtained and verified? Yes  Other? No  Any new allergies since your discharge? No  Dietary orders reviewed? Yes Do you have support at home? Yes   Home Care and Equipment/Supplies: Were home health services ordered? no If so, what is the name of the agency? N/a  Has the agency set up a time to come to the patient's home? not applicable Were any new equipment or medical supplies ordered?  No What is the name of the medical supply agency? N/a Were you able to get the supplies/equipment? not applicable Do you have any questions related to the use of the equipment or supplies? No  Functional Questionnaire: (I = Independent and D = Dependent) ADLs: I  Bathing/Dressing- I  Meal Prep- I  Eating- I  Maintaining continence- I  Transferring/Ambulation- I  Managing Meds- I  Follow up appointments reviewed:  PCP Hospital f/u appt confirmed? No  Patient declined wants to transfer [facility] f/u appt confirmed? No   Are transportation arrangements needed? No  If their condition worsens, is the pt aware to call PCP or go to the Emergency Dept.? Yes Was the patient provided with contact information for the PCP's office or ED? Yes Was to pt encouraged to call back with questions or concerns? Yes Jaime Crumble, LPN [place] Direct Dial [phone]

## 2022-07-13 NOTE — Telephone Encounter (Signed)
Transition Care Management Unsuccessful Follow-up Telephone Call  Date of discharge and from where:  07/09/2022, [facility]  Attempts:  2nd Attempt  Reason for unsuccessful TCM follow-up call:  Left voice message [phone], call back requested   Patient has an appointment at [facility] with Juluis Mire, NP - 07/14/2022.

## 2022-07-14 ENCOUNTER — Ambulatory Visit (INDEPENDENT_AMBULATORY_CARE_PROVIDER_SITE_OTHER): Payer: Commercial Managed Care - HMO | Admitting: Primary Care

## 2022-07-19 ENCOUNTER — Ambulatory Visit (HOSPITAL_COMMUNITY)
Admission: EM | Admit: 2022-07-19 | Discharge: 2022-07-19 | Disposition: A | Discharge status: Home / Self Care | Payer: Commercial Managed Care - HMO | Attending: Internal Medicine | Admitting: Internal Medicine

## 2022-07-19 ENCOUNTER — Encounter (HOSPITAL_COMMUNITY): Payer: Self-pay | Admitting: Emergency Medicine

## 2022-07-19 DIAGNOSIS — H6992 Unspecified Eustachian tube disorder, left ear: Secondary | ICD-10-CM | POA: Diagnosis not present

## 2022-07-19 MED ORDER — LORATADINE 10 MG PO TABS: 10.0000 mg | ORAL_TABLET | Freq: Every day | ORAL | 0 refills | Status: DC

## 2022-07-19 NOTE — Discharge Instructions (Addendum)
Your ear symptoms are likely due to fluid build up behind your left ear drum due to recent head cold. This should go away in the next 1-2 weeks.   Take claritin 10mg  once a day to help to dry up some of the fluid behind the left ear drum.  Schedule an appointment with the ear nose and throat provider listed on your paperwork as needed if your symptoms don't get better or go away in the next 1-2 weeks.

## 2022-07-19 NOTE — ED Provider Notes (Signed)
[place]    CSN: [id number] Arrival date & time: 07/19/22  0805      History   Chief Complaint Chief Complaint  Patient presents with   Ear Fullness    HPI Jaime Adams is a 62 y.o. female.   Patient presents urgent care for evaluation of left ear fullness and "whooshing sound" to the left ear that started 2 days ago.  She denies ear pain, foreign body to the left ear, tinnitus, fever/chills, recent antibiotic use, recent steroid use, and dizziness.  She does states she experienced sore throat, nasal congestion, headache, and cough last week but this has since improved and resolved.  She does not use Q-tips to her ears and denies recent cerumen impaction that she is aware of to the left ear.  Right ear is asymptomatic.  No recent falls or injuries to the left ear.  She denies history of TMJ dysfunction and grinding teeth at night.  No skin changes to the left ear or left neck reported.  She is not experiencing any neck pain, nausea, vomiting, or drainage from the left ear.  She is no longer experiencing nasal congestion, sore throat, or cough.  Denies shortness of breath, chest pain, chest tightness, and heart palpitations.  No history of chronic respiratory problems reported.  She has not attempted use of any over-the-counter medications to help with symptoms prior to arrival urgent care.   Ear Fullness    Past Medical History:  Diagnosis Date   Cellulitis 03/18/2016   Facial cellulitis 03/18/2016   High cholesterol    Hypertension     Patient Active Problem List   Diagnosis Date Noted   Vitamin D deficiency 07/09/2022   AKI (acute kidney injury) ([place]) 07/08/2022   Dehydration 07/08/2022   Hyponatremia 07/08/2022   Hypertension 07/08/2022    History reviewed. No pertinent surgical history.  OB History   No obstetric history on file.      Home Medications    Prior to Admission medications   Medication Sig Start Date End Date Taking?  Authorizing Provider  loratadine (CLARITIN) 10 MG tablet Take 1 tablet (10 mg total) by mouth daily. 07/19/22  Yes Talbot Grumbling, FNP  aspirin EC 81 MG tablet Take 81 mg by mouth in the morning.    [provider]  atorvastatin (LIPITOR) 20 MG tablet Take 1 tablet (20 mg total) by mouth daily. Patient not taking: Reported on 06/16/2022 07/28/20   Kerin Perna, NP    Family History Family History  Problem Relation Age of Onset   Hypertension Mother    Alzheimer's disease Mother    Cirrhosis Father    Cancer Maternal Aunt    Heart attack Brother    Stroke Brother    Aneurysm Brother    Diabetes Brother     Social History Social History   Tobacco Use   Smoking status: Every Day    Packs/day: 1.00    Years: 20.00    Total pack years: 20.00    Types: Cigarettes   Smokeless tobacco: Never  Vaping Use   Vaping Use: Never used  Substance Use Topics   Alcohol use: Yes    Alcohol/week: 0.0 standard drinks of alcohol   Drug use: No     Allergies   Patient has no known allergies.   Review of Systems Review of Systems Per HPI  Physical Exam Triage Vital Signs ED Triage Vitals  Enc Vitals Group     BP 07/19/22  0901 118/81     Pulse Rate 07/19/22 0901 87     Resp 07/19/22 0901 17     Temp 07/19/22 0901 98.7 F (37.1 C)     Temp Source 07/19/22 0901 Oral     SpO2 07/19/22 0901 97 %     Weight --      Height --      Head Circumference --      Peak Flow --      Pain Score 07/19/22 0900 0     Pain Loc --      Pain Edu? --      Excl. in GC? --    No data found.  Updated Vital Signs BP 118/81 (BP Location: Left Arm)   Pulse 87   Temp 98.7 F (37.1 C) (Oral)   Resp 17   SpO2 97%   Visual Acuity Right Eye Distance:   Left Eye Distance:   Bilateral Distance:    Right Eye Near:   Left Eye Near:    Bilateral Near:     Physical Exam Vitals and nursing note reviewed.  Constitutional:      Appearance: She is not ill-appearing or  toxic-appearing.  HENT:     Head: Normocephalic and atraumatic.     Jaw: There is normal jaw occlusion. No trismus, tenderness, swelling, pain on movement or malocclusion.     Comments: No popping, clicking, or pain to bilateral TMJs.    Right Ear: Hearing, tympanic membrane, ear canal and external ear normal.     Left Ear: Hearing, ear canal and external ear normal. No decreased hearing noted. No drainage, swelling or tenderness. There is no impacted cerumen. No foreign body. No mastoid tenderness. No PE tube. No hemotympanum. Tympanic membrane is bulging. Tympanic membrane is not injected, scarred, perforated, erythematous or retracted.     Ears:     Comments: Singular small piece of cerumen to the left ear canal appreciated to exam, cerumen is not obstructing view of left TM.  Left TM appears bulging, however not injected or erythematous.  Left canal is normal and is not swollen or erythematous.     Nose: Nose normal.     Mouth/Throat:     Lips: Pink.  Eyes:     General: Lids are normal. Vision grossly intact. Gaze aligned appropriately.     Extraocular Movements: Extraocular movements intact.     Conjunctiva/sclera: Conjunctivae normal.  Cardiovascular:     Rate and Rhythm: Normal rate and regular rhythm.     Heart sounds: Normal heart sounds, S1 normal and S2 normal.  Pulmonary:     Effort: Pulmonary effort is normal. No respiratory distress.     Breath sounds: Normal breath sounds and air entry.  Musculoskeletal:     Cervical back: Neck supple.  Skin:    General: Skin is warm and dry.     Capillary Refill: Capillary refill takes less than 2 seconds.     Findings: No rash.  Neurological:     General: No focal deficit present.     Mental Status: She is alert and oriented to person, place, and time. Mental status is at baseline.     Cranial Nerves: No dysarthria or facial asymmetry.  Psychiatric:        Mood and Affect: Mood normal.        Speech: Speech normal.        Behavior:  Behavior normal.        Thought Content: Thought content normal.  Judgment: Judgment normal.      UC Treatments / Results  Labs (all labs ordered are listed, but only abnormal results are displayed) Labs Reviewed - No data to display  EKG   Radiology No results found.  Procedures Procedures (including critical care time)  Medications Ordered in UC Medications - No data to display  Initial Impression / Assessment and Plan / UC Course  I have reviewed the triage vital signs and the nursing notes.  Pertinent labs & imaging results that were available during my care of the patient were reviewed by me and considered in my medical decision making (see chart for details).   1.  Eustachian tube dysfunction of the left ear Presentation is consistent with likely residual eustachian tube dysfunction of the left ear post viral illness that will improve with use of Claritin daily for the next 1 to 2 weeks to clear up fluid behind the left eardrum contributing to patient's symptoms.  Vital signs are hemodynamically stable.  Patient is well-appearing.  She may follow-up with PCP should symptoms fail to improve in the next 1-2 weeks with above interventions. She has also been given contact information for ENT to be used as needed.   Discussed physical exam and available lab work findings in clinic with patient.  Counseled patient regarding appropriate use of medications and potential side effects for all medications recommended or prescribed today. Discussed red flag signs and symptoms of worsening condition,when to call the PCP office, return to urgent care, and when to seek higher level of care in the emergency department. Patient verbalizes understanding and agreement with plan. All questions answered. Patient discharged in stable condition.    Final Clinical Impressions(s) / UC Diagnoses   Final diagnoses:  Eustachian tube dysfunction, left     Discharge Instructions      Your  ear symptoms are likely due to fluid build up behind your left ear drum due to recent head cold. This should go away in the next 1-2 weeks.   Take claritin 10mg  once a day to help to dry up some of the fluid behind the left ear drum.  Schedule an appointment with the ear nose and throat provider listed on your paperwork as needed if your symptoms don't get better or go away in the next 1-2 weeks.    ED Prescriptions     Medication Sig Dispense Auth. Provider   loratadine (CLARITIN) 10 MG tablet Take 1 tablet (10 mg total) by mouth daily. 30 tablet Talbot Grumbling, FNP      PDMP not reviewed this encounter.   Talbot Grumbling, [place] 07/19/22 1006

## 2022-07-19 NOTE — ED Triage Notes (Signed)
Pt reports for 2 days having trouble hearing out of left ear and feels full. Denies pain.

## 2022-08-10 ENCOUNTER — Ambulatory Visit (INDEPENDENT_AMBULATORY_CARE_PROVIDER_SITE_OTHER): Payer: Commercial Managed Care - HMO

## 2022-08-10 VITALS — BP 146/75 | HR 81 | Temp 98.5°F | Ht 69.0 in | Wt 120.6 lb

## 2022-08-10 DIAGNOSIS — E559 Vitamin D deficiency, unspecified: Secondary | ICD-10-CM | POA: Diagnosis not present

## 2022-08-10 DIAGNOSIS — E785 Hyperlipidemia, unspecified: Secondary | ICD-10-CM | POA: Diagnosis not present

## 2022-08-10 DIAGNOSIS — N179 Acute kidney failure, unspecified: Secondary | ICD-10-CM | POA: Diagnosis not present

## 2022-08-10 DIAGNOSIS — F17200 Nicotine dependence, unspecified, uncomplicated: Secondary | ICD-10-CM | POA: Insufficient documentation

## 2022-08-10 DIAGNOSIS — E7800 Pure hypercholesterolemia, unspecified: Secondary | ICD-10-CM

## 2022-08-10 DIAGNOSIS — I1A0 Resistant hypertension: Secondary | ICD-10-CM | POA: Diagnosis not present

## 2022-08-10 DIAGNOSIS — F17210 Nicotine dependence, cigarettes, uncomplicated: Secondary | ICD-10-CM

## 2022-08-10 HISTORY — DX: Nicotine dependence, unspecified, uncomplicated: F17.200

## 2022-08-10 MED ORDER — AMLODIPINE BESYLATE 5 MG PO TABS: 5.0000 mg | ORAL_TABLET | Freq: Every day | ORAL | 11 refills | Status: DC

## 2022-08-10 MED ORDER — ROSUVASTATIN CALCIUM 20 MG PO TABS: 20.0000 mg | ORAL_TABLET | Freq: Every day | ORAL | 11 refills | Status: DC

## 2022-08-10 NOTE — Progress Notes (Deleted)
  07/07/22 :BP is a 62yo F w/ hx of HTN, hypokalemia, HLD admitted for weakness, hypotension, AKI, electrolyte abnormalities 2/2 starting Valsartan-HCTZ   HTN holding Diovan-HCTZ in setting of soft BPs earlier, can restart if needed.   Mixed HLD LDL 160 05/2022 Asa 81 Atorv 20  Vitamin D deficiency Vitamin <4  50k/week injection  1) Valsartan-HCTZ was stopped. Will need outpt follow-up to monitor BP and start BP meds as tolerated. 2) Aldosterone and Renin activity labs were obtained to workup hyperaldo. Please follow-up results outpt.  -renil aldo labs wnl -AKI persistent at discharge repeat BMP  HCM Cologuard ordered? Mammo Lung cancer screen

## 2022-08-10 NOTE — Assessment & Plan Note (Addendum)
LDL 160 05/2022 with ASCVD 10 yr risk 23.3%. On atorvastatin 30m in the past but reports hasn't taken in 2 years since opting not to get care from that provider anymore. Reports being on ASA 81 since 2016. Had paresthesias at that time, MRI only showing microvascular changes. Followe dwith neurology, no mention of TIA. Will discontinue ASA as appears to be prescribed for primary prevention and patient with no hx of stroke, TIA, CAD, PAD. -Stop ASA 81 daily -Start rosuvastatin 255mdaily -lipid panel in 3 months

## 2022-08-10 NOTE — Assessment & Plan Note (Signed)
Has smoked 1 pack per 2 days since age 62. Denies cough or sob. On exam saturating well, normal wob, lctab. Will work on smoking cessation, interested in patches. Gave her the 1-800-QUIT-NOW number to get patches and gum for free. -f/u 1 month, discuss chantix vs wellbutrin and see if patches obtained -CT lung cancer screening ordered

## 2022-08-10 NOTE — Patient Instructions (Signed)
Thank you, Ms.Alberteen Spindle Cissell for allowing [place] to provide your care today. Today we discussed :  High blood pressure: We will start you on a blood pressure medicine called amlodipine. This does not affect the kidneys. Your blood pressure was 146/75 today, our goal is closer to 120/80.  High cholesterol: We will start you on rosuvastatin 20 and check your cholesterol in 3 months.  Acute kidney injury: Your kidney function was still a bit reduced at your hospital discharge. We will get a BMP to check this today.  Tobacco use: Call 1-800-QUIT-NOW(1-737-699-9055 ). You should be able to talk with them and get patches and gum for free.  Mammogram: This was ordered, please schedule when they call.  Lung cancer screening: This was ordered, please schedule when they call.  Cologuard: Please complete this and get it sent in.  I have ordered the following labs for you:   Lab Orders         BMP8+Anion Gap        I have ordered the following medication/changed the following medications:   Stop the following medications: Medications Discontinued During This Encounter  Medication Reason   atorvastatin (LIPITOR) 20 MG tablet    aspirin EC 81 MG tablet      Start the following medications: Meds ordered this encounter  Medications   rosuvastatin (CRESTOR) 20 MG tablet    Sig: Take 1 tablet (20 mg total) by mouth daily.    Dispense:  30 tablet    Refill:  11   amLODipine (NORVASC) 5 MG tablet    Sig: Take 1 tablet (5 mg total) by mouth daily.    Dispense:  30 tablet    Refill:  11     Follow up:  1 month      We look forward to seeing you next time. Please call our clinic at [phone] if you have any questions or concerns. The best time to call is Monday-Friday from 9am-4pm, but there is someone available 24/7. If after hours or the weekend, call the main hospital number and ask for the Internal Medicine Resident On-Call. If you need medication refills, please notify your  pharmacy one week in advance and they will send [place] a request.   Thank you for trusting me with your care. Wishing you the best!   Iona Coach, MD [place]

## 2022-08-10 NOTE — Assessment & Plan Note (Signed)
Recently discharged 07/07/22 after starting valsartan-HCTZ and developing hyperkalemia and AKI. No BP meds at discharge. BP in clinic 146/75 today. Will repeat BMP and start amlodipine. -start amlodipine 39m -bmp -f/u 1 month

## 2022-08-10 NOTE — Assessment & Plan Note (Signed)
Discharged 07/07/2022 with persistent AKI and cr of 1.16 improved from admission. -BMP

## 2022-08-10 NOTE — Progress Notes (Signed)
New Patient Office Visit  Subjective    Patient ID: Ethell Pello, female    DOB: June 17, 1961  Age: 62 y.o. MRN: [id number]  CC:  Chief Complaint  Patient presents with   Hospitalization Follow-up    HPI Cory Szymanowski presents to establish care   Outpatient Encounter Medications as of 08/10/2022  Medication Sig   amLODipine (NORVASC) 5 MG tablet Take 1 tablet (5 mg total) by mouth daily.   rosuvastatin (CRESTOR) 20 MG tablet Take 1 tablet (20 mg total) by mouth daily.   loratadine (CLARITIN) 10 MG tablet Take 1 tablet (10 mg total) by mouth daily.   [DISCONTINUED] aspirin EC 81 MG tablet Take 81 mg by mouth in the morning.   [DISCONTINUED] atorvastatin (LIPITOR) 20 MG tablet Take 1 tablet (20 mg total) by mouth daily. (Patient not taking: Reported on 06/16/2022)   No facility-administered encounter medications on file as of 08/10/2022.    Past Medical History:  Diagnosis Date   Cellulitis 03/18/2016   Facial cellulitis 03/18/2016   High cholesterol    Hypertension    Tobacco use disorder 08/10/2022   Vitamin D deficiency 07/09/2022    No past surgical history on file.  Family History  Problem Relation Age of Onset   Hypertension Mother    Alzheimer's disease Mother    Cirrhosis Father    Cancer Maternal Aunt    Heart attack Brother    Stroke Brother    Aneurysm Brother    Diabetes Brother     Social History   Socioeconomic History   Marital status: Married    Spouse name: Not on file   Number of children: Not on file   Years of education: Not on file   Highest education level: Not on file  Occupational History   Not on file  Tobacco Use   Smoking status: Every Day    Packs/day: 1.00    Years: 20.00    Total pack years: 20.00    Types: Cigarettes   Smokeless tobacco: Never  Vaping Use   Vaping Use: Never used  Substance and Sexual Activity   Alcohol use: Yes    Alcohol/week: 0.0 standard drinks of alcohol   Drug use: No   Sexual  activity: Not Currently  Other Topics Concern   Not on file  Social History Narrative   Lives alone.  Rents a room.  Has 2 children.  Works at [employer].  Education: high school.   Social Determinants of Health   Financial Resource Strain: Not on file  Food Insecurity: Food Insecurity Present (07/08/2022)   Hunger Vital Sign    Worried About Running Out of Food in the Last Year: Sometimes true    Ran Out of Food in the Last Year: Sometimes true  Transportation Needs: No Transportation Needs (07/09/2022)   PRAPARE - Hydrologist (Medical): No    Lack of Transportation (Non-Medical): No  Physical Activity: Not on file  Stress: Not on file  Social Connections: Not on file  Intimate Partner Violence: Not At Risk (07/08/2022)   Humiliation, Afraid, Rape, and Kick questionnaire    Fear of Current or Ex-Partner: No    Emotionally Abused: No    Physically Abused: No    Sexually Abused: No    Review of Systems  All other systems reviewed and are negative.       Objective    BP (!) 146/75 (BP Location: Left Arm, Patient Position: Sitting,  Cuff Size: Small)   Pulse 81   Temp 98.5 F (36.9 C) (Oral)   Ht 5' 9"$  (1.753 m)   Wt 120 lb 9.6 oz (54.7 kg)   SpO2 100%   BMI 17.81 kg/m   Physical Exam Constitutional:      General: She is not in acute distress.    Appearance: Normal appearance. She is normal weight.  Eyes:     General: No scleral icterus.    Extraocular Movements: Extraocular movements intact.     Conjunctiva/sclera: Conjunctivae normal.  Cardiovascular:     Rate and Rhythm: Normal rate and regular rhythm.     Pulses: Normal pulses.     Heart sounds: Murmur heard.     No friction rub. No gallop.     Comments: 3/6 LUSB Holosystolic murmur  Pulmonary:     Effort: Pulmonary effort is normal. No respiratory distress.     Breath sounds: Normal breath sounds. No wheezing or rales.  Musculoskeletal:     Right lower leg: No edema.      Left lower leg: No edema.  Skin:    General: Skin is warm and dry.     Capillary Refill: Capillary refill takes less than 2 seconds.  Neurological:     General: No focal deficit present.     Mental Status: She is alert.  Psychiatric:        Mood and Affect: Mood normal.        Behavior: Behavior normal.        Thought Content: Thought content normal.         Assessment & Plan:   Problem List Items Addressed This Visit       Cardiovascular and Mediastinum   Hypertension    Recently discharged 07/07/22 after starting valsartan-HCTZ and developing hyperkalemia and AKI. No BP meds at discharge. BP in clinic 146/75 today. Will repeat BMP and start amlodipine. -start amlodipine 13m -bmp -f/u 1 month      Relevant Medications   rosuvastatin (CRESTOR) 20 MG tablet   amLODipine (NORVASC) 5 MG tablet     Genitourinary   AKI (acute kidney injury) ([place]    Discharged 07/07/2022 with persistent AKI and cr of 1.16 improved from admission. -BMP      Relevant Orders   BMP8+Anion Gap     Other   Vitamin D deficiency    Vitamin D <4 05/2022. Currently taking 50,000u once weekly vitamin D replacement. -50,000u once weekly x8 weeks, then 800u daily -recheck vitamin D in 3-4 months      Hyperlipidemia    LDL 160 05/2022 with ASCVD 10 yr risk 23.3%. On atorvastatin 223min the past but reports hasn't taken in 2 years since opting not to get care from that provider anymore. Reports being on ASA 81 since 2016. Had paresthesias at that time, MRI only showing microvascular changes. Followe dwith neurology, no mention of TIA. Will discontinue ASA as appears to be prescribed for primary prevention and patient with no hx of stroke, TIA, CAD, PAD. -Stop ASA 81 daily -Start rosuvastatin 208maily -lipid panel in 3 months      Relevant Medications   rosuvastatin (CRESTOR) 20 MG tablet   amLODipine (NORVASC) 5 MG tablet   Tobacco use disorder    Has smoked 1 pack per 2 days since age 29.1Denies cough or sob. On exam saturating well, normal wob, lctab. Will work on smoking cessation, interested in patches. Gave her the 1-800-QUIT-NOW number to get patches  and gum for free. -f/u 1 month, discuss chantix vs wellbutrin and see if patches obtained -CT lung cancer screening ordered      Other Visit Diagnoses     Hyperlipidemia LDL goal <100    -  Primary   Relevant Medications   rosuvastatin (CRESTOR) 20 MG tablet   amLODipine (NORVASC) 5 MG tablet       Return in about 4 weeks (around 09/07/2022).   Iona Coach, MD

## 2022-08-10 NOTE — Progress Notes (Signed)
Internal Medicine Clinic Attending  Case discussed with Dr. Stann Mainland  At the time of the visit.  We reviewed the resident's history and exam and pertinent patient test results.  I agree with the assessment, diagnosis, and plan of care documented in the resident's note.

## 2022-08-10 NOTE — Assessment & Plan Note (Signed)
Vitamin D <4 05/2022. Currently taking 50,000u once weekly vitamin D replacement. -50,000u once weekly x8 weeks, then 800u daily -recheck vitamin D in 3-4 months

## 2022-08-11 LAB — BMP8+ANION GAP
Anion Gap: 16 mmol/L (ref 10.0–18.0)
BUN/Creatinine Ratio: 8 — ABNORMAL LOW (ref 12–28)
BUN: 5 mg/dL — ABNORMAL LOW (ref 8–27)
CO2: 23 mmol/L (ref 20–29)
Calcium: 9.5 mg/dL (ref 8.7–10.3)
Chloride: 103 mmol/L (ref 96–106)
Creatinine, Ser: 0.63 mg/dL (ref 0.57–1.00)
Glucose: 87 mg/dL (ref 70–99)
Potassium: 4.1 mmol/L (ref 3.5–5.2)
Sodium: 142 mmol/L (ref 134–144)
eGFR: 100 mL/min/{1.73_m2} (ref >59)

## 2022-08-11 NOTE — Progress Notes (Signed)
Called patient, left voicemail and will try again later. Kidney function and electrolytes wnl suggesting resolution of AKI. No change in management at this time.

## 2022-09-09 ENCOUNTER — Ambulatory Visit: Payer: Medicaid Other | Admitting: Student

## 2022-09-09 ENCOUNTER — Encounter: Payer: Self-pay | Admitting: Student

## 2022-09-09 VITALS — BP 126/76 | HR 84 | Temp 98.1°F | Ht 69.0 in | Wt 124.4 lb

## 2022-09-09 DIAGNOSIS — F17210 Nicotine dependence, cigarettes, uncomplicated: Secondary | ICD-10-CM | POA: Diagnosis not present

## 2022-09-09 DIAGNOSIS — F419 Anxiety disorder, unspecified: Secondary | ICD-10-CM | POA: Diagnosis not present

## 2022-09-09 DIAGNOSIS — M545 Low back pain, unspecified: Secondary | ICD-10-CM

## 2022-09-09 DIAGNOSIS — E559 Vitamin D deficiency, unspecified: Secondary | ICD-10-CM

## 2022-09-09 DIAGNOSIS — F17200 Nicotine dependence, unspecified, uncomplicated: Secondary | ICD-10-CM

## 2022-09-09 DIAGNOSIS — Z0000 Encounter for general adult medical examination without abnormal findings: Secondary | ICD-10-CM

## 2022-09-09 DIAGNOSIS — R634 Abnormal weight loss: Secondary | ICD-10-CM

## 2022-09-09 NOTE — Assessment & Plan Note (Signed)
BP improved today on amlodipine. Continue current medications.

## 2022-09-09 NOTE — Patient Instructions (Addendum)
It was a pleasure seeing you in clinic today  For your back pain I have ordered physical therapy Continue Tylenol as needed for pain and if that does not help you can also take low-dose-200 mg every 8 hours avoid taking ibuprofen more than 3 days in a row  Vitamin D We will check your levels with lab work today and I will call you with the results and that you know how much vitamin D you should be taking  Send in your stool testing for colon cancer screening  For your weight please try ensures or other protein shakes with dinner if you are not feeling too tired to make dinner  Great job cutting down on smoking please continue using the patches and continue to work on cutting down the number of cigarettes in a day.  Please let us know if that he can be of any assistance with this.  Please follow up with Jaime Adams  our counselor for financial stressors  Follow-up in 3 months

## 2022-09-09 NOTE — Assessment & Plan Note (Addendum)
Currently smoking about half a pack a day. Has nicotine patches and gum.  Using 12 mg patches daily.  She feels this helps with cravings.  Is still slowly cutting down.  We discussed medication with Chantix or Zyban but she would like to avoid this for now.  Will continue to monitor.

## 2022-09-10 DIAGNOSIS — M545 Low back pain, unspecified: Secondary | ICD-10-CM | POA: Insufficient documentation

## 2022-09-10 DIAGNOSIS — R634 Abnormal weight loss: Secondary | ICD-10-CM | POA: Insufficient documentation

## 2022-09-10 DIAGNOSIS — F419 Anxiety disorder, unspecified: Secondary | ICD-10-CM | POA: Insufficient documentation

## 2022-09-10 DIAGNOSIS — Z0000 Encounter for general adult medical examination without abnormal findings: Secondary | ICD-10-CM | POA: Insufficient documentation

## 2022-09-10 LAB — VITAMIN D 25 HYDROXY (VIT D DEFICIENCY, FRACTURES): Vit D, 25-Hydroxy: 64.7 ng/mL (ref 30.0–100.0)

## 2022-09-10 NOTE — Assessment & Plan Note (Addendum)
Patient ports increased's financial stressors since hospitalization in January for AKI on an ARB and thiazide.  PHQ-9 of 7 and GAD-7 of 9 indicating some mild anxiety and depression.  Reports she feels tired and does not feel she has the energy to prepare dinner at nighttime.  Likely this is exacerbated by recent health and financial concerns.  Referral to [facility].

## 2022-09-10 NOTE — Progress Notes (Signed)
Internal Medicine Clinic Attending ? ?Case discussed with Dr. Liang  At the time of the visit.  We reviewed the resident?s history and exam and pertinent patient test results.  I agree with the assessment, diagnosis, and plan of care documented in the resident?s note. ? ?

## 2022-09-10 NOTE — Progress Notes (Signed)
Established Patient Office Visit  Subjective   Patient ID: Jaime Adams, female    DOB: Feb 22, 1961  Age: 62 y.o. MRN: [id number]  Chief Complaint  Patient presents with   Follow-up   Left lower back pain    Jaime Adams is a 62 y.o. person living with a history listed below who presents to clinic for left sided back pain. Please refer to problem based charting for further details and assessment and plan of current problem and chronic medical conditions.      Patient Active Problem List   Diagnosis Date Noted   Anxiety 09/10/2022   Lumbar back pain 09/10/2022   Weight decrease 09/10/2022   Hyperlipidemia 08/10/2022   Tobacco use disorder 08/10/2022   Vitamin D deficiency 07/09/2022   Hypertension 07/08/2022    ROS: negative as per HPI    Objective:     BP 126/76 (BP Location: Right Arm, Patient Position: Sitting, Cuff Size: Normal)   Pulse 84   Temp 98.1 F (36.7 C) (Oral)   Ht 5\' 9"  (1.753 m)   Wt 124 lb 6.4 oz (56.4 kg)   SpO2 100% Comment: RA  BMI 18.37 kg/m  BP Readings from Last 3 Encounters:  09/09/22 126/76  08/10/22 (!) 146/75  07/19/22 118/81      Physical Exam Constitutional:      Appearance: Normal appearance.  HENT:     Mouth/Throat:     Mouth: Mucous membranes are moist.     Pharynx: Oropharynx is clear.  Eyes:     Extraocular Movements: Extraocular movements intact.     Conjunctiva/sclera: Conjunctivae normal.     Pupils: Pupils are equal, round, and reactive to light.     Comments: No pallor  Cardiovascular:     Rate and Rhythm: Normal rate and regular rhythm.  Pulmonary:     Effort: Pulmonary effort is normal.     Breath sounds: No rhonchi or rales.  Abdominal:     General: Abdomen is flat. Bowel sounds are normal. There is no distension.     Palpations: Abdomen is soft.     Tenderness: There is no abdominal tenderness.  Musculoskeletal:        General: Normal range of motion.     Right lower leg: No edema.      Left lower leg: No edema.     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     Capillary Refill: Capillary refill takes less than 2 seconds.  Neurological:     General: No focal deficit present.     Mental Status: She is alert and oriented to person, place, and time.  Psychiatric:        Mood and Affect: Mood normal.        Behavior: Behavior normal.     Results for orders placed or performed in visit on 09/09/22  Vitamin D (25 hydroxy)  Result Value Ref Range   Vit D, 25-Hydroxy 64.7 30.0 - 100.0 ng/mL    Last CBC Lab Results  Component Value Date   WBC 9.5 07/08/2022   HGB 12.7 07/08/2022   HCT 36.3 07/08/2022   MCV 96.8 07/08/2022   MCH 33.9 07/08/2022   RDW 12.3 07/08/2022   PLT 251 0000000   Last metabolic panel Lab Results  Component Value Date   GLUCOSE 87 08/10/2022  NA 142 08/10/2022   K 4.1 08/10/2022   CL 103 08/10/2022   CO2 23 08/10/2022   BUN 5 (L) 08/10/2022   CREATININE 0.63 08/10/2022   EGFR 100 08/10/2022   CALCIUM 9.5 08/10/2022   PROT 8.8 (H) 07/07/2022   ALBUMIN 4.6 07/07/2022   LABGLOB 3.6 06/16/2022   AGRATIO 1.4 06/16/2022   BILITOT 1.2 07/07/2022   ALKPHOS 92 07/07/2022   AST 36 07/07/2022   ALT 13 07/07/2022   ANIONGAP 12 07/09/2022      The ASCVD Risk score (Arnett DK, et al., 2019) failed to calculate for the following reasons:   The valid HDL cholesterol range is 20 to 100 mg/dL    Assessment & Plan:   Problem List Items Addressed This Visit       Other   Vitamin D deficiency    Vitamin D level checked today      Relevant Orders   Vitamin D (25 hydroxy) (Completed)   Tobacco use disorder - Primary     Currently smoking about half a pack a day. Has nicotine patches and gum.  Has been using      Anxiety    Patient ports increased's financial stressors since hospitalization in  January for AKI on an ARB and thiazide.  PHQ-9 of 7 and GAD-7 of 9 indicating some mild anxiety and depression.  Reports she feels tired and does not feel she has the energy to prepare dinner at nighttime.  Likely this is exacerbated by recent health and financial concerns.  Referral to [facility].      Relevant Orders   Ambulatory referral to [place]   Lumbar back pain    Having left-sided back pain for the last 2 months.  Pain is mild and dull on the left side that is nonradiating.  Pain is worse around midday during work and improves after resting when she gets off work.  She has not tried any medications.  No red flag symptoms.  No midline tenderness.  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  Suspect her pain is muscular strain, she does work at a [employer] and frequently lifts heavy loads.  Advised Tylenol as needed for pain.  NSAIDs such as needed in addition to Tylenol, will have her avoid taking this for more than 3 days in row given her recent AKI.  Referral to PT.      Relevant Orders   Ambulatory referral to Physical Therapy   Weight decrease    Patient is concerned about her weight.  Her BMI is 18.3.  States she is not eating much due to being tired after work and not having the energy to make dinner.  She often has a small salad for dinner.  She denies any dysphagia or trouble with swallowing.  She is not having pain or emesis to believe that prevents her from preparing meals for herself.   Does endorse some mild anxiety and depression which may be contributing to this.  On chart review her weight fluctuates between 120 and 128 pounds in the last 2 years, without significant weight loss.  Advise she can try protein supplement drinks such as Ensure on days she does not feel up to making dinner.  Will also work on her mood see anxiety for further details.       No follow-ups on file.    Iona Beard, MD

## 2022-09-10 NOTE — Assessment & Plan Note (Signed)
Reminded her to send in her Cologuard

## 2022-09-10 NOTE — Assessment & Plan Note (Addendum)
Having left-sided back pain for the last 2 months.  Pain is mild and dull on the left side that is nonradiating.  Pain is worse around midday during work and improves after resting when she gets off work.  She has not tried any medications.  No red flag symptoms.  No midline tenderness.  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  Suspect her pain is muscular strain, she does work at a [employer] and frequently lifts heavy loads.  Advised Tylenol as needed for pain.  NSAIDs such as needed in addition to Tylenol, will have her avoid taking this for more than 3 days in row given her recent AKI.  Referral to PT.

## 2022-09-10 NOTE — Assessment & Plan Note (Signed)
Vitamin D level checked today

## 2022-09-10 NOTE — Assessment & Plan Note (Signed)
Patient is concerned about her weight.  Her BMI is 18.3.  States she is not eating much due to being tired after work and not having the energy to make dinner.  She often has a small salad for dinner.  She denies any dysphagia or trouble with swallowing.  She is not having pain or emesis to believe that prevents her from preparing meals for herself.   Does endorse some mild anxiety and depression which may be contributing to this.  On chart review her weight fluctuates between 120 and 128 pounds in the last 2 years, without significant weight loss.  Advise she can try protein supplement drinks such as Ensure on days she does not feel up to making dinner.  Will also work on her mood see anxiety for further details.

## 2022-09-15 LAB — COLOGUARD: COLOGUARD: NEGATIVE

## 2022-10-11 ENCOUNTER — Ambulatory Visit (HOSPITAL_COMMUNITY)
Admission: EM | Admit: 2022-10-11 | Discharge: 2022-10-11 | Disposition: A | Discharge status: Home / Self Care | Payer: BLUE CROSS/BLUE SHIELD

## 2022-10-11 ENCOUNTER — Encounter (HOSPITAL_COMMUNITY): Payer: Self-pay | Admitting: Emergency Medicine

## 2022-10-11 DIAGNOSIS — J309 Allergic rhinitis, unspecified: Secondary | ICD-10-CM

## 2022-10-11 DIAGNOSIS — H1013 Acute atopic conjunctivitis, bilateral: Secondary | ICD-10-CM

## 2022-10-11 DIAGNOSIS — R051 Acute cough: Secondary | ICD-10-CM

## 2022-10-11 MED ORDER — BENZONATATE 100 MG PO CAPS: 100.0000 mg | ORAL_CAPSULE | Freq: Three times a day (TID) | ORAL | 0 refills | Status: DC

## 2022-10-11 MED ORDER — LORATADINE 10 MG PO TABS: 10.0000 mg | ORAL_TABLET | Freq: Every day | ORAL | 0 refills | Status: DC

## 2022-10-11 MED ORDER — FLUTICASONE PROPIONATE 50 MCG/ACT NA SUSP: 1.0000 | Freq: Every day | NASAL | 0 refills | Status: DC

## 2022-10-11 MED ORDER — OLOPATADINE HCL 0.1 % OP SOLN: 1.0000 [drp] | Freq: Two times a day (BID) | OPHTHALMIC | 0 refills | Status: DC

## 2022-10-11 NOTE — ED Provider Notes (Signed)
[facility]    CSN: [id number] Arrival date & time: 10/11/22  1711      History   Chief Complaint Chief Complaint  Patient presents with  . Otalgia    HPI Jaime Adams is a 62 y.o. female.   Patient presents to urgent care for evaluation of    Otalgia   Past Medical History:  Diagnosis Date  . AKI (acute kidney injury) 07/08/2022  . Cellulitis 03/18/2016  . Dehydration 07/08/2022  . Facial cellulitis 03/18/2016  . High cholesterol   . Hypertension   . Hyponatremia 07/08/2022  . Tobacco use disorder 08/10/2022  . Vitamin D deficiency 07/09/2022    Patient Active Problem List   Diagnosis Date Noted  . Anxiety 09/10/2022  . Lumbar back pain 09/10/2022  . Weight decrease 09/10/2022  . Healthcare maintenance 09/10/2022  . Hyperlipidemia 08/10/2022  . Tobacco use disorder 08/10/2022  . Vitamin D deficiency 07/09/2022  . Hypertension 07/08/2022    History reviewed. No pertinent surgical history.  OB History   No obstetric history on file.      Home Medications    Prior to Admission medications   Medication Sig Start Date End Date Taking? Authorizing Provider  benzonatate (TESSALON) 100 MG capsule Take 1 capsule (100 mg total) by mouth every 8 (eight) hours. 10/11/22  Yes Carlisle Beers, FNP  fluticasone (FLONASE) 50 MCG/ACT nasal spray Place 1 spray into both nostrils daily. 10/11/22  Yes Carlisle Beers, FNP  olopatadine (PATANOL) 0.1 % ophthalmic solution Place 1 drop into both eyes 2 (two) times daily. 10/11/22  Yes Carlisle Beers, FNP  penicillin v potassium (VEETID) 500 MG tablet Take 500 mg by mouth 4 (four) times daily. 10/07/22  Yes [provider]  amLODipine (NORVASC) 5 MG tablet Take 1 tablet (5 mg total) by mouth daily. 08/10/22 08/10/23  Willette Cluster, MD  loratadine (CLARITIN) 10 MG tablet Take 1 tablet (10 mg total) by mouth daily. 10/11/22   Carlisle Beers, FNP  rosuvastatin (CRESTOR) 20 MG  tablet Take 1 tablet (20 mg total) by mouth daily. 08/10/22 08/10/23  Willette Cluster, MD    Family History Family History  Problem Relation Age of Onset  . Hypertension Mother   . Alzheimer's disease Mother   . Cirrhosis Father   . Cancer Maternal Aunt   . Heart attack Brother   . Stroke Brother   . Aneurysm Brother   . Diabetes Brother     Social History Social History   Tobacco Use  . Smoking status: Every Day    Packs/day: 1.00    Years: 20.00    Additional pack years: 0.00    Total pack years: 20.00    Types: Cigarettes  . Smokeless tobacco: Never  . Tobacco comments:    Sometimes less.  Vaping Use  . Vaping Use: Never used  Substance Use Topics  . Alcohol use: Yes    Alcohol/week: 0.0 standard drinks of alcohol  . Drug use: No     Allergies   Patient has no known allergies.   Review of Systems Review of Systems  HENT:  Positive for ear pain.      Physical Exam Triage Vital Signs ED Triage Vitals  Enc Vitals Group     BP 10/11/22 1839 120/73     Pulse Rate 10/11/22 1839 66     Resp 10/11/22 1839 15     Temp 10/11/22 1839 98.1 F (36.7 C)     Temp  Source 10/11/22 1839 Oral     SpO2 10/11/22 1839 96 %     Weight --      Height --      Head Circumference --      Peak Flow --      Pain Score 10/11/22 1838 8     Pain Loc --      Pain Edu? --      Excl. in GC? --    No data found.  Updated Vital Signs BP 120/73 (BP Location: Right Arm)   Pulse 66   Temp 98.1 F (36.7 C) (Oral)   Resp 15   SpO2 96%   Visual Acuity Right Eye Distance:   Left Eye Distance:   Bilateral Distance:    Right Eye Near:   Left Eye Near:    Bilateral Near:     Physical Exam   UC Treatments / Results  Labs (all labs ordered are listed, but only abnormal results are displayed) Labs Reviewed - No data to display  EKG   Radiology No results found.  Procedures Procedures (including critical care time)  Medications Ordered in UC Medications - No data  to display  Initial Impression / Assessment and Plan / UC Course  I have reviewed the triage vital signs and the nursing notes.  Pertinent labs & imaging results that were available during my care of the patient were reviewed by me and considered in my medical decision making (see chart for details).     *** Final Clinical Impressions(s) / UC Diagnoses   Final diagnoses:  Allergic conjunctivitis of both eyes and rhinitis     Discharge Instructions      Your symptoms are likely due to environmental allergies.  Avoid exposure to allergens. Take oral antihistamine (Either Zyrtec, Claritin, or Allegra) and use Flonase daily as directed. You can buy these medications over the counter. These medications can take a few days to fully kick in to your body and start working. Return for any new or worsening symptoms.  If your eyes become itchy, you may purchase olopatadine (Pataday) eyedrops over the counter and use as directed to relieve watery/itchy eyes associated with allergies as well.   Tessalon perles every 8 hours as needed for cough.  If your symptoms are severe, please go to the emergency room for further evaluation. Schedule an appointment with your primary care provider for follow-up and further management of your seasonal allergies as well as ongoing preventive healthcare. I hope you feel better!     ED Prescriptions     Medication Sig Dispense Auth. Provider   loratadine (CLARITIN) 10 MG tablet Take 1 tablet (10 mg total) by mouth daily. 30 tablet Reita May M, FNP   fluticasone [facility]) 50 MCG/ACT nasal spray Place 1 spray into both nostrils daily. 16 g Reita May M, FNP   benzonatate (TESSALON) 100 MG capsule Take 1 capsule (100 mg total) by mouth every 8 (eight) hours. 21 capsule Reita May M, FNP   olopatadine (PATANOL) 0.1 % ophthalmic solution Place 1 drop into both eyes 2 (two) times daily. 5 mL Carlisle Beers, FNP      PDMP not  reviewed this encounter.

## 2022-10-11 NOTE — Discharge Instructions (Addendum)
Your symptoms are likely due to environmental allergies.  Avoid exposure to allergens. Take oral antihistamine (Either Zyrtec, Claritin, or Allegra) and use Flonase daily as directed. You can buy these medications over the counter. These medications can take a few days to fully kick in to your body and start working. Return for any new or worsening symptoms.  If your eyes become itchy, you may purchase olopatadine (Pataday) eyedrops over the counter and use as directed to relieve watery/itchy eyes associated with allergies as well.   Tessalon perles every 8 hours as needed for cough.  If your symptoms are severe, please go to the emergency room for further evaluation. Schedule an appointment with your primary care provider for follow-up and further management of your seasonal allergies as well as ongoing preventive healthcare. I hope you feel better!

## 2022-10-11 NOTE — ED Triage Notes (Signed)
Pt c/o left ear pain and fullness for about a week. Reports had some sore throat as well. Took Ibuprofen

## 2022-10-19 ENCOUNTER — Encounter: Payer: Self-pay | Admitting: Student

## 2022-10-19 ENCOUNTER — Other Ambulatory Visit: Payer: Self-pay

## 2022-10-19 ENCOUNTER — Ambulatory Visit (INDEPENDENT_AMBULATORY_CARE_PROVIDER_SITE_OTHER): Payer: BLUE CROSS/BLUE SHIELD | Admitting: Student

## 2022-10-19 VITALS — BP 95/56 | HR 79 | Temp 98.5°F | Ht 69.0 in | Wt 123.4 lb

## 2022-10-19 DIAGNOSIS — I10 Essential (primary) hypertension: Secondary | ICD-10-CM | POA: Diagnosis not present

## 2022-10-19 DIAGNOSIS — J029 Acute pharyngitis, unspecified: Secondary | ICD-10-CM

## 2022-10-19 MED ORDER — LORATADINE 10 MG PO TABS: 10.0000 mg | ORAL_TABLET | Freq: Every day | ORAL | 11 refills | Status: DC

## 2022-10-19 NOTE — Assessment & Plan Note (Signed)
Patient with 3 days of sore throat with mild nonproductive cough.  Reports like nausea without emesis.  Reports sore throat gradually developed over the course of 3 days.  Denies any fevers or chills.  No recent sick contacts.  She went to urgent care for on 10/11/2022 with left ear fullness and treated for allergic with antihistamine and Flonase.  States its improved her symptoms.  No longer having ear symptoms.  Does continue to have significant rhinitis since she ran of Claritin 3 days ago.  Suspect her sore throat is due to postnasal drainage in the setting of allergic rhinitis.  Likely worsened since she ran out of her Claritin.    Refilled Claritin Discussed proper technique to administer Flonase Follow-up if symptoms or not improving

## 2022-10-19 NOTE — Patient Instructions (Addendum)
It was a pleasure seeing you in clinic today  Please continue flonase and claritin for allergies I think mucous from your nose is irritating your throat and causing the itching  Nasal irrigation may also help with your symptoms  If itching eyes you can pick up pat a day eye drop to help with your symptoms  Please call with you list of medications   Followup in 2 weeks as needed if symptoms are worsening or nor improving

## 2022-10-21 NOTE — Assessment & Plan Note (Signed)
BP low today at 95/56. Has not drank much water this morning. Is taking amlodipine 5 mg daily for hypertension. Previously on valsartan-HCTZ which she has not been taking. She is check her prescriptions at home and call if she has been on her prior medications.  She will monitor home blood pressures and call clinic if persistently low.

## 2022-10-21 NOTE — Progress Notes (Signed)
Acute Office Visit  Subjective:     Patient ID: Jaime Adams, female    DOB: Jun 09, 1961, 62 y.o.   MRN: [id number]  Chief Complaint  Patient presents with   Gastroesophageal Reflux    REFLUX / MEDICATION REFILL    Jaime Adams is a 62 person who presents today for sore throat for the last 3 days.  Recently seen at urgent care for allergic rhinitis.    Review of Systems  Constitutional:  Negative for chills and fever.  HENT:  Positive for congestion and sore throat. Negative for sinus pain.        Rhinorrhea, no odynophagia  Respiratory:  Positive for cough. Negative for sputum production, shortness of breath, wheezing and stridor.         Objective:    BP (!) 95/56 (BP Location: Right Arm, Cuff Size: Normal)   Pulse 79   Temp 98.5 F (36.9 C) (Oral)   Ht 5\' 9"  (1.753 m)   Wt 123 lb 6.4 oz (56 kg)   BMI 18.22 kg/m    Physical Exam Constitutional:      Appearance: Normal appearance.  HENT:     Head: Normocephalic and atraumatic.     Right Ear: Hearing, tympanic membrane and ear canal normal.     Left Ear: Hearing, tympanic membrane and ear canal normal.     Nose: Congestion and rhinorrhea present.     Right Turbinates: Swollen.     Left Turbinates: Swollen.     Mouth/Throat:     Mouth: Mucous membranes are moist.     Pharynx: Oropharynx is clear. Posterior oropharyngeal erythema present. No pharyngeal swelling or oropharyngeal exudate.     Tonsils: No tonsillar exudate.  Eyes:     Extraocular Movements: Extraocular movements intact.     Conjunctiva/sclera: Conjunctivae normal.     Pupils: Pupils are equal, round, and reactive to light.  Cardiovascular:     Rate and Rhythm: Normal rate and regular rhythm.     Pulses: Normal pulses.     Heart sounds: No murmur heard. Pulmonary:     Effort: Pulmonary effort is normal.     Breath sounds: No wheezing, rhonchi or rales.  Abdominal:     General: Abdomen is flat. Bowel sounds are normal. There is no  distension.     Palpations: Abdomen is soft.     Tenderness: There is no abdominal tenderness.  Musculoskeletal:        General: Normal range of motion.     Right lower leg: No edema.     Left lower leg: No edema.  Skin:    General: Skin is warm and dry.     Capillary Refill: Capillary refill takes less than 2 seconds.  Neurological:     General: No focal deficit present.     Mental Status: She is alert and oriented to person, place, and time.  Psychiatric:        Mood and Affect: Mood normal.        Behavior: Behavior normal.     No results found for any visits on 10/19/22.      Assessment & Plan:   Problem List Items Addressed This Visit     Hypertension    BP low today at 95/56. Has not drank much water this morning. Is taking amlodipine 5 mg daily for hypertension. Previously on valsartan-HCTZ which she has not been taking. She is check her prescriptions at home and call if she has been on  her prior medications.  She will monitor home blood pressures and call clinic if persistently low.       Sore throat - Primary    Patient with 3 days of sore throat with mild nonproductive cough.  Reports like nausea without emesis.  Reports sore throat gradually developed over the course of 3 days.  Denies any fevers or chills.  No recent sick contacts.  She went to urgent care for on 10/11/2022 with left ear fullness and treated for allergic with antihistamine and Flonase.  States its improved her symptoms.  No longer having ear symptoms.  Does continue to have significant rhinitis since she ran of Claritin 3 days ago.  Suspect her sore throat is due to postnasal drainage in the setting of allergic rhinitis.  Likely worsened since she ran out of her Claritin.    Refilled Claritin Discussed proper technique to administer Flonase Follow-up if symptoms or not improving       Meds ordered this encounter  Medications   loratadine (CLARITIN) 10 MG tablet    Sig: Take 1 tablet (10 mg  total) by mouth daily.    Dispense:  30 tablet    Refill:  11    Return in about 2 weeks (around 11/02/2022), or if symptoms worsen or fail to improve.  Quincy Simmonds, MD

## 2022-10-21 NOTE — Progress Notes (Signed)
Internal Medicine Clinic Attending ? ?Case discussed with Dr. Liang  At the time of the visit.  We reviewed the resident?s history and exam and pertinent patient test results.  I agree with the assessment, diagnosis, and plan of care documented in the resident?s note. ? ?

## 2022-11-02 ENCOUNTER — Ambulatory Visit
Admission: EM | Admit: 2022-11-02 | Discharge: 2022-11-02 | Disposition: A | Discharge status: Home / Self Care | Payer: BLUE CROSS/BLUE SHIELD | Attending: Nurse Practitioner | Admitting: Nurse Practitioner

## 2022-11-02 DIAGNOSIS — K0889 Other specified disorders of teeth and supporting structures: Secondary | ICD-10-CM

## 2022-11-02 DIAGNOSIS — S025XXB Fracture of tooth (traumatic), initial encounter for open fracture: Secondary | ICD-10-CM | POA: Diagnosis not present

## 2022-11-02 MED ORDER — LIDOCAINE VISCOUS HCL 2 % MT SOLN: 15.0000 mL | OROMUCOSAL | 0 refills | Status: DC | PRN

## 2022-11-02 MED ORDER — AMOXICILLIN-POT CLAVULANATE 875-125 MG PO TABS: 1.0000 | ORAL_TABLET | Freq: Two times a day (BID) | ORAL | 0 refills | Status: DC

## 2022-11-02 MED ORDER — NAPROXEN 375 MG PO TABS: 375.0000 mg | ORAL_TABLET | Freq: Two times a day (BID) | ORAL | 0 refills | Status: DC | PRN

## 2022-11-02 NOTE — Discharge Instructions (Addendum)
Start Augmentin twice daily for 7 days Naproxen as needed for pain. Lidocaine solution as needed for numbing up the tooth.  Apply with a Q-tip Follow-up with your dentist at your scheduled appointment on 5/16 Please go to the ER for any worsening symptoms that occur prior to seeing your dentist

## 2022-11-02 NOTE — ED Provider Notes (Addendum)
[facility]    CSN: [id number] Arrival date & time: 11/02/22  1734      History   Chief Complaint Chief Complaint  Patient presents with   Dental Pain    HPI Jaime Adams is a 62 y.o. female presents for evaluation of dental pain.  Patient reports today she was eating crab legs and she chipped/cracked her right upper tooth.  States she has been having pain since that time.  She contacted her dentist but is not able to be seen until 2 days from now.  She does report she already has some swelling around the gumline.  No drainage or fevers.  No history of dental abscesses.  She has been taking ibuprofen 200 mg as well as BC powder with no relief of her pain.   Dental Pain   Past Medical History:  Diagnosis Date   AKI (acute kidney injury) (HCC) 07/08/2022   Cellulitis 03/18/2016   Dehydration 07/08/2022   Facial cellulitis 03/18/2016   High cholesterol    Hypertension    Hyponatremia 07/08/2022   Tobacco use disorder 08/10/2022   Vitamin D deficiency 07/09/2022    Patient Active Problem List   Diagnosis Date Noted   Sore throat 10/19/2022   Anxiety 09/10/2022   Lumbar back pain 09/10/2022   Weight decrease 09/10/2022   Healthcare maintenance 09/10/2022   Hyperlipidemia 08/10/2022   Tobacco use disorder 08/10/2022   Vitamin D deficiency 07/09/2022   Hypertension 07/08/2022    History reviewed. No pertinent surgical history.  OB History   No obstetric history on file.      Home Medications    Prior to Admission medications   Medication Sig Start Date End Date Taking? Authorizing Provider  amoxicillin-clavulanate (AUGMENTIN) 875-125 MG tablet Take 1 tablet by mouth every 12 (twelve) hours. 11/02/22  Yes Radford Pax, NP  lidocaine (XYLOCAINE) 2 % solution Use as directed 15 mLs in the mouth or throat as needed (tooth pain). Apply to affected tooth with a Q-tip 11/02/22  Yes Radford Pax, NP  naproxen (NAPROSYN) 375 MG tablet Take 1 tablet (375  mg total) by mouth 2 (two) times daily as needed (tooth pain). 11/02/22  Yes Radford Pax, NP  amLODipine (NORVASC) 5 MG tablet Take 1 tablet (5 mg total) by mouth daily. 08/10/22 08/10/23  Willette Cluster, MD  benzonatate (TESSALON) 100 MG capsule Take 1 capsule (100 mg total) by mouth every 8 (eight) hours. 10/11/22   Carlisle Beers, FNP  fluticasone (FLONASE) 50 MCG/ACT nasal spray Place 1 spray into both nostrils daily. 10/11/22   Carlisle Beers, FNP  loratadine (CLARITIN) 10 MG tablet Take 1 tablet (10 mg total) by mouth daily. 10/19/22 10/14/23  Quincy Simmonds, MD  olopatadine (PATANOL) 0.1 % ophthalmic solution Place 1 drop into both eyes 2 (two) times daily. 10/11/22   Carlisle Beers, FNP  rosuvastatin (CRESTOR) 20 MG tablet Take 1 tablet (20 mg total) by mouth daily. 08/10/22 08/10/23  Willette Cluster, MD    Family History Family History  Problem Relation Age of Onset   Hypertension Mother    Alzheimer's disease Mother    Cirrhosis Father    Cancer Maternal Aunt    Heart attack Brother    Stroke Brother    Aneurysm Brother    Diabetes Brother     Social History Social History   Tobacco Use   Smoking status: Every Day    Packs/day: 1.00    Years: 20.00  Additional pack years: 0.00    Total pack years: 20.00    Types: Cigarettes   Smokeless tobacco: Never   Tobacco comments:    Sometimes less.  Vaping Use   Vaping Use: Never used  Substance Use Topics   Alcohol use: Yes    Alcohol/week: 0.0 standard drinks of alcohol   Drug use: No     Allergies   Patient has no known allergies.   Review of Systems Review of Systems  HENT:  Positive for dental problem.      Physical Exam Triage Vital Signs ED Triage Vitals  Enc Vitals Group     BP 11/02/22 1747 131/81     Pulse Rate 11/02/22 1747 81     Resp 11/02/22 1747 16     Temp 11/02/22 1747 98.8 F (37.1 C)     Temp Source 11/02/22 1747 Oral     SpO2 11/02/22 1747 98 %     Weight --      Height  --      Head Circumference --      Peak Flow --      Pain Score 11/02/22 1746 10     Pain Loc --      Pain Edu? --      Excl. in GC? --    No data found.  Updated Vital Signs BP 131/81 (BP Location: Right Arm)   Pulse 81   Temp 98.8 F (37.1 C) (Oral)   Resp 16   SpO2 98%   Visual Acuity Right Eye Distance:   Left Eye Distance:   Bilateral Distance:    Right Eye Near:   Left Eye Near:    Bilateral Near:     Physical Exam Vitals and nursing note reviewed.  Constitutional:      General: She is not in acute distress.    Appearance: Normal appearance. She is not ill-appearing.  HENT:     Head: Normocephalic and atraumatic.     Mouth/Throat:     Lips: Pink.     Mouth: Mucous membranes are moist.     Pharynx: Oropharynx is clear.      Comments: Several missing teeth.  Cracked tooth #12 with mild swelling of gumline.  No palpable abscess.  Dental caries and multiple teeth. Eyes:     Pupils: Pupils are equal, round, and reactive to light.  Cardiovascular:     Rate and Rhythm: Normal rate.  Pulmonary:     Effort: Pulmonary effort is normal.  Skin:    General: Skin is warm and dry.  Neurological:     General: No focal deficit present.     Mental Status: She is alert and oriented to person, place, and time.  Psychiatric:        Mood and Affect: Mood normal.        Behavior: Behavior normal.      UC Treatments / Results  Labs (all labs ordered are listed, but only abnormal results are displayed) Labs Reviewed - No data to display BMP8+Anion Gap Order: [id number] Status: Final result     Visible to patient: No (inaccessible in MyChart)     Next appt: None     Dx: AKI (acute kidney injury) (HCC)   1 Result Note          Component Ref Range & Units 2 mo ago (08/10/22) 3 mo ago (07/09/22) 3 mo ago (07/08/22) 3 mo ago (07/08/22) 3 mo ago (07/07/22) 3 mo ago (07/07/22) 4 mo  ago (06/16/22)  Glucose 70 - 99 mg/dL 87 161 High  CM 96 CM 096 High  CM 92 CM 87 CM  100 High   BUN 8 - 27 mg/dL 5 Low  24 High  R 27 High  R 29 High  R 30 High  R 29 High  R 10  Creatinine, Ser 0.57 - 1.00 mg/dL 0.45 4.09 High  R 8.11 High  R 1.47 High  R 2.12 High  R 2.79 High  R 0.74  eGFR >59 mL/min/1.73 100      91  BUN/Creatinine Ratio 12 - 28 8 Low       14  Sodium 134 - 144 mmol/L 142 129 Low  R 126 Low  R 125 Low  R 123 Low  R 126 Low  R 142  Potassium 3.5 - 5.2 mmol/L 4.1 4.0 R 3.8 R 4.4 R 5.0 R 5.2 High  R, CM 3.8  Chloride 96 - 106 mmol/L 103 93 Low  R 92 Low  R 90 Low  R 90 Low  R 86 Low  R 100  CO2 20 - 29 mmol/L 23 24 R 24 R 22 R 20 Low  R 23 R 23  Anion Gap 10.0 - 18.0 mmol/L 16.0 12 R, CM 10 R, CM 13 R, CM 13 R, CM 17 High  R, CM   Calcium 8.7 - 10.3 mg/dL 9.5 9.4 R 9.4 R 9.6 R 9.4 R 10.1 R 10.2  Resulting Agency [facility]         Narrative Performed by: Verdell Carmine Performed at:  [address] [facility] [address] [place], [place]  [id number] Lab Director: Jolene Schimke MD, Phone:  [phone]    Specimen Collected: 08/10/22 10:47 Last Resulted: 08/11/22 05:37        EKG   Radiology No results found.  Procedures Procedures (including critical care time)  Medications Ordered in UC Medications - No data to display  Initial Impression / Assessment and Plan / UC Course  I have reviewed the triage vital signs and the nursing notes.  Pertinent labs & imaging results that were available during my care of the patient were reviewed by me and considered in my medical decision making (see chart for details).     Will start Augmentin Naproxen twice a day as needed for pain She can do lidocaine solution to the area as needed She is to follow-up with her dentist at her scheduled appointment in 2 days ER precautions reviewed and patient verbalized understanding Final Clinical Impressions(s) / UC Diagnoses   Final diagnoses:  Pain, dental  Open fracture of tooth,  initial encounter     Discharge Instructions      Start Augmentin twice daily for 7 days Naproxen as needed for pain. Lidocaine solution as needed for numbing up the tooth.  Apply with a Q-tip Follow-up with your dentist at your scheduled appointment on 5/16 Please go to the ER for any worsening symptoms that occur prior to seeing your dentist    ED Prescriptions     Medication Sig Dispense Auth. Provider   amoxicillin-clavulanate (AUGMENTIN) 875-125 MG tablet Take 1 tablet by mouth every 12 (twelve) hours. 14 tablet Radford Pax, NP   naproxen (NAPROSYN) 375 MG tablet Take 1 tablet (375 mg total) by mouth 2 (two) times daily as needed (tooth pain). 10 tablet Radford Pax, NP   lidocaine (  XYLOCAINE) 2 % solution Use as directed 15 mLs in the mouth or throat as needed (tooth pain). Apply to affected tooth with a Q-tip 100 mL Radford Pax, NP      PDMP not reviewed this encounter.   Radford Pax, NP 11/02/22 1803    Radford Pax, NP 11/02/22 [phone]

## 2022-11-02 NOTE — ED Triage Notes (Signed)
Pt presents with dental pain x 2 days. Pt was eating crab legs and chipped her tooth. Has taken ibuprofen/BC powder and has not had any relief. Pt states she will be seeing her dentist on Thursday.

## 2023-01-06 ENCOUNTER — Emergency Department (HOSPITAL_COMMUNITY)
Admission: EM | Admit: 2023-01-06 | Discharge: 2023-01-06 | Disposition: A | Discharge status: Home / Self Care | Payer: BLUE CROSS/BLUE SHIELD | Attending: Emergency Medicine | Admitting: Emergency Medicine

## 2023-01-06 ENCOUNTER — Other Ambulatory Visit: Payer: Self-pay

## 2023-01-06 ENCOUNTER — Encounter (HOSPITAL_COMMUNITY): Payer: Self-pay | Admitting: Emergency Medicine

## 2023-01-06 ENCOUNTER — Emergency Department (HOSPITAL_COMMUNITY): Payer: BLUE CROSS/BLUE SHIELD

## 2023-01-06 DIAGNOSIS — I10 Essential (primary) hypertension: Secondary | ICD-10-CM | POA: Insufficient documentation

## 2023-01-06 DIAGNOSIS — Z79899 Other long term (current) drug therapy: Secondary | ICD-10-CM | POA: Insufficient documentation

## 2023-01-06 DIAGNOSIS — Z7901 Long term (current) use of anticoagulants: Secondary | ICD-10-CM | POA: Insufficient documentation

## 2023-01-06 DIAGNOSIS — I480 Paroxysmal atrial fibrillation: Secondary | ICD-10-CM | POA: Insufficient documentation

## 2023-01-06 DIAGNOSIS — R079 Chest pain, unspecified: Secondary | ICD-10-CM | POA: Diagnosis present

## 2023-01-06 LAB — CBC WITH DIFFERENTIAL/PLATELET
Abs Immature Granulocytes: 0.02 10*3/uL (ref 0.00–0.07)
Basophils Absolute: 0 10*3/uL (ref 0.0–0.1)
Basophils Relative: 1 %
Eosinophils Absolute: 0 10*3/uL (ref 0.0–0.5)
Eosinophils Relative: 0 %
HCT: 39.4 % (ref 36.0–46.0)
Hemoglobin: 13.3 g/dL (ref 12.0–15.0)
Immature Granulocytes: 0 %
Lymphocytes Relative: 24 %
Lymphs Abs: 2 10*3/uL (ref 0.7–4.0)
MCH: 32.4 pg (ref 26.0–34.0)
MCHC: 33.8 g/dL (ref 30.0–36.0)
MCV: 95.9 fL (ref 80.0–100.0)
Monocytes Absolute: 0.4 10*3/uL (ref 0.1–1.0)
Monocytes Relative: 4 %
Neutro Abs: 6.2 10*3/uL (ref 1.7–7.7)
Neutrophils Relative %: 71 %
Platelets: 239 10*3/uL (ref 150–400)
RBC: 4.11 MIL/uL (ref 3.87–5.11)
RDW: 13.8 % (ref 11.5–15.5)
WBC: 8.7 10*3/uL (ref 4.0–10.5)
nRBC: 0 % (ref 0.0–0.2)

## 2023-01-06 LAB — COMPREHENSIVE METABOLIC PANEL
ALT: 14 U/L (ref 0–44)
AST: 26 U/L (ref 15–41)
Albumin: 4.4 g/dL (ref 3.5–5.0)
Alkaline Phosphatase: 99 U/L (ref 38–126)
Anion gap: 10 (ref 5–15)
BUN: 14 mg/dL (ref 8–23)
CO2: 22 mmol/L (ref 22–32)
Calcium: 9.3 mg/dL (ref 8.9–10.3)
Chloride: 106 mmol/L (ref 98–111)
Creatinine, Ser: 0.73 mg/dL (ref 0.44–1.00)
GFR, Estimated: >60 mL/min (ref >60)
Glucose, Bld: 94 mg/dL (ref 70–99)
Potassium: 3.8 mmol/L (ref 3.5–5.1)
Sodium: 138 mmol/L (ref 135–145)
Total Bilirubin: 1.3 mg/dL — ABNORMAL HIGH (ref 0.3–1.2)
Total Protein: 8.8 g/dL — ABNORMAL HIGH (ref 6.5–8.1)

## 2023-01-06 LAB — MAGNESIUM: Magnesium: 1.7 mg/dL (ref 1.7–2.4)

## 2023-01-06 LAB — TROPONIN I (HIGH SENSITIVITY): Troponin I (High Sensitivity): 9 ng/L (ref <18)

## 2023-01-06 MED ORDER — APIXABAN 5 MG PO TABS
5.0000 mg | ORAL_TABLET | Freq: Once | ORAL | Status: AC
  Administered 2023-01-06: 5 mg via ORAL
  Filled 2023-01-06: qty 1

## 2023-01-06 MED ORDER — SODIUM CHLORIDE 0.9 % IV BOLUS
500.0000 mL | Freq: Once | INTRAVENOUS | Status: AC
  Administered 2023-01-06: 500 mL via INTRAVENOUS

## 2023-01-06 MED ORDER — APIXABAN (ELIQUIS) EDUCATION KIT FOR DVT/PE PATIENTS
PACK | Freq: Once | Status: DC
  Filled 2023-01-06: qty 1

## 2023-01-06 MED ORDER — APIXABAN 5 MG PO TABS: 5.0000 mg | ORAL_TABLET | Freq: Two times a day (BID) | ORAL | 0 refills | Status: DC

## 2023-01-06 MED ORDER — ASPIRIN 81 MG PO CHEW
324.0000 mg | CHEWABLE_TABLET | Freq: Once | ORAL | Status: AC
  Administered 2023-01-06: 324 mg via ORAL
  Filled 2023-01-06: qty 4

## 2023-01-06 NOTE — ED Triage Notes (Signed)
Pt reports falling when she missed a step 4 days ago. Pt landed on back and butt. Pain is to middle of chest. HR going from 120-160s.

## 2023-01-06 NOTE — Discharge Instructions (Addendum)
Your EKG today showed that you have atrial fibrillation.  This is currently resolved but she will need to follow-up with a cardiologist.  This does increase your risk of stroke based on your risk factors we discussed putting you on a blood thinner.  You are being put on Eliquis.  This has the potential to cause spontaneous bleeding or increased bleeding whenever you injure yourself.  If you suffer minor injuries you may have more serious injuries than you realize, especially if you develop a head injury.  Do not take aspirin, ibuprofen, Aleve, Advil, Naprosyn, or other NSAIDs while you are on Eliquis.  Follow-up with the cardiologist listed.  If you develop recurrent, continued, or worsening chest pain, shortness of breath, fever, vomiting, abdominal or back pain, or any other new/concerning symptoms then return to the ER for evaluation.

## 2023-01-06 NOTE — ED Provider Notes (Signed)
Jaime Adams EMERGENCY DEPARTMENT AT [facility] Provider Note   CSN: [id number] Arrival date & time: 01/06/23  1216     History  Chief Complaint  Patient presents with   Chest Pain   Fall    [address] Jaime Adams is a 62 y.o. female.  HPI 62 year old female with a history of HTN and a heart murmur presents with chest pain. 5 days ago she slipped and fell down 2 steps.  Landed on her back/buttocks but has no residual pain.  However starting 3 days ago she developed chest pain.  It occurs in the middle of her chest whenever she moves, twists, pushes or walks.  No shortness of breath.  Feels like a pressure in her chest.  She denies any palpitations or dizziness/lightheadedness.  No leg swelling.  Home Medications Prior to Admission medications   Medication Sig Start Date End Date Taking? Authorizing Provider  apixaban (ELIQUIS) 5 MG TABS tablet Take 1 tablet (5 mg total) by mouth 2 (two) times daily. 01/06/23 02/05/23 Yes Pricilla Loveless, MD  amLODipine (NORVASC) 5 MG tablet Take 1 tablet (5 mg total) by mouth daily. 08/10/22 08/10/23  Willette Cluster, MD  amoxicillin-clavulanate (AUGMENTIN) 875-125 MG tablet Take 1 tablet by mouth every 12 (twelve) hours. 11/02/22   Radford Pax, NP  benzonatate (TESSALON) 100 MG capsule Take 1 capsule (100 mg total) by mouth every 8 (eight) hours. 10/11/22   Carlisle Beers, FNP  fluticasone (FLONASE) 50 MCG/ACT nasal spray Place 1 spray into both nostrils daily. 10/11/22   Carlisle Beers, FNP  lidocaine (XYLOCAINE) 2 % solution Use as directed 15 mLs in the mouth or throat as needed (tooth pain). Apply to affected tooth with a Q-tip 11/02/22   Radford Pax, NP  loratadine (CLARITIN) 10 MG tablet Take 1 tablet (10 mg total) by mouth daily. 10/19/22 10/14/23  Quincy Simmonds, MD  olopatadine (PATANOL) 0.1 % ophthalmic solution Place 1 drop into both eyes 2 (two) times daily. 10/11/22   Carlisle Beers, FNP  rosuvastatin (CRESTOR) 20 MG  tablet Take 1 tablet (20 mg total) by mouth daily. 08/10/22 08/10/23  Willette Cluster, MD      Allergies    Patient has no known allergies.    Review of Systems   Review of Systems  Respiratory:  Negative for shortness of breath.   Cardiovascular:  Positive for chest pain. Negative for palpitations and leg swelling.  Gastrointestinal:  Negative for abdominal pain.  Musculoskeletal:  Negative for back pain.  Neurological:  Negative for syncope, light-headedness and headaches.    Physical Exam Updated Vital Signs BP (!) 171/90 (BP Location: Right Arm)   Pulse 83   Temp 98.6 F (37 C) (Oral)   Resp 16   Ht 5\' 9"  (1.753 m)   Wt 59 kg   SpO2 100%   BMI 19.20 kg/m  Physical Exam Vitals and nursing note reviewed.  Constitutional:      General: She is not in acute distress.    Appearance: She is well-developed. She is not ill-appearing or diaphoretic.  HENT:     Head: Normocephalic and atraumatic.  Cardiovascular:     Rate and Rhythm: Normal rate and regular rhythm.     Heart sounds: Murmur heard.  Pulmonary:     Effort: Pulmonary effort is normal.     Breath sounds: Normal breath sounds.  Abdominal:     Palpations: Abdomen is soft.     Tenderness: There is no  abdominal tenderness.  Skin:    General: Skin is warm and dry.  Neurological:     Mental Status: She is alert.     ED Results / Procedures / Treatments   Labs (all labs ordered are listed, but only abnormal results are displayed) Labs Reviewed  COMPREHENSIVE METABOLIC PANEL - Abnormal; Notable for the following components:      Result Value   Total Protein 8.8 (*)    Total Bilirubin 1.3 (*)    All other components within normal limits  MAGNESIUM  CBC WITH DIFFERENTIAL/PLATELET  TROPONIN I (HIGH SENSITIVITY)    EKG EKG Interpretation Date/Time:  Thursday January 06 2023 14:38:58 EDT Ventricular Rate:  83 PR Interval:  169 QRS Duration:  71 QT Interval:  366 QTC Calculation: 430 R Axis:   70  Text  Interpretation: Sinus rhythm no acute ST/T changes Pointy T waves similar to 2018 Afib resolved Reconfirmed by Pricilla Loveless [phone]) on 01/06/2023 3:10:05 PM  Radiology DG Chest 2 View  Result Date: 01/06/2023 CLINICAL DATA:  Chest pain after fall 4 days ago. EXAM: CHEST - 2 VIEW COMPARISON:  July 07, 2022. FINDINGS: The heart size and mediastinal contours are within normal limits. Both lungs are clear. The visualized skeletal structures are unremarkable. IMPRESSION: No active cardiopulmonary disease. Electronically Signed   By: Lupita Raider M.D.   On: 01/06/2023 13:32    Procedures Procedures    Medications Ordered in ED Medications  apixaban (ELIQUIS) tablet 5 mg (has no administration in time range)  sodium chloride 0.9 % bolus 500 mL (500 mLs Intravenous New Bag/Given 01/06/23 1348)  aspirin chewable tablet 324 mg (324 mg Oral Given 01/06/23 1337)    ED Course/ Medical Decision Making/ A&P                             Medical Decision Making Amount and/or Complexity of Data Reviewed Labs: ordered.    Details: Normal troponin. Unremarkable electrolytes Radiology: ordered and independent interpretation performed.    Details: No CHF ECG/medicine tests: ordered and independent interpretation performed.    Details: Afib with RVR  Risk OTC drugs.   Patient was in afib with RVR initially. While I was in the room, she converted to sinus rhythm on the monitor. No recurrence noted. Troponin is negative after days of symptoms. Highly doubt ACS. Doubt PE or dissection. Mildly tender when I placed my stethoscope on her chest. Doubt significant trauma given no direct injury or bruising.  Given all this and now that she is in normal sinus rhythm, I think is reasonable to discharge her with A-fib clinic follow-up.  She has a known history of a murmur and only trace mitral valve regurgitation.  Thus will start her on Eliquis after discussion of potential risk/benefits.  Otherwise, she  appears stable for outpatient management.  Discussed no NSAID or aspirin use.  Will give return precautions.  CHA2DS2/VAS Stroke Risk Points      2 >= 2 Points: High Risk  1 to 1.99 Points: Medium Risk  0 Points: Low Risk    Last Change: N/A      This score determines the patient's risk of having a stroke if the  patient has atrial fibrillation.      This score is not applicable to this patient. Components are not  calculated.           Final Clinical Impression(s) / ED Diagnoses Final diagnoses:  Paroxysmal  atrial fibrillation [facility])    Rx / DC Orders ED Discharge Orders          Ordered    Amb Referral to AFIB Clinic        01/06/23 1515    apixaban (ELIQUIS) 5 MG TABS tablet  2 times daily        01/06/23 1515              Pricilla Loveless, MD 01/06/23 1536

## 2023-01-21 ENCOUNTER — Inpatient Hospital Stay (HOSPITAL_COMMUNITY)
Admission: RE | Admit: 2023-01-21 | Discharge: 2023-01-21 | Disposition: A | Discharge status: Home / Self Care | Payer: BLUE CROSS/BLUE SHIELD | Source: Ambulatory Visit | Attending: Internal Medicine | Admitting: Internal Medicine

## 2023-01-21 ENCOUNTER — Encounter (HOSPITAL_COMMUNITY): Payer: Self-pay | Admitting: Internal Medicine

## 2023-01-21 ENCOUNTER — Ambulatory Visit (HOSPITAL_COMMUNITY)
Admission: RE | Admit: 2023-01-21 | Discharge: 2023-01-21 | Disposition: A | Discharge status: Home / Self Care | Payer: BLUE CROSS/BLUE SHIELD | Source: Ambulatory Visit | Attending: Internal Medicine | Admitting: Internal Medicine

## 2023-01-21 VITALS — BP 120/78 | HR 75 | Ht 69.0 in | Wt 127.3 lb

## 2023-01-21 DIAGNOSIS — Z7901 Long term (current) use of anticoagulants: Secondary | ICD-10-CM | POA: Diagnosis not present

## 2023-01-21 DIAGNOSIS — I480 Paroxysmal atrial fibrillation: Secondary | ICD-10-CM | POA: Diagnosis not present

## 2023-01-21 DIAGNOSIS — G4730 Sleep apnea, unspecified: Secondary | ICD-10-CM | POA: Insufficient documentation

## 2023-01-21 DIAGNOSIS — I351 Nonrheumatic aortic (valve) insufficiency: Secondary | ICD-10-CM | POA: Diagnosis not present

## 2023-01-21 DIAGNOSIS — I10 Essential (primary) hypertension: Secondary | ICD-10-CM | POA: Diagnosis not present

## 2023-01-21 NOTE — Addendum Note (Signed)
Encounter addended by: Learta Codding, CMA on: 01/21/2023 12:54 PM  Actions taken: Order list changed, Diagnosis association updated

## 2023-01-21 NOTE — Progress Notes (Signed)
Primary Care Physician: Kathleen Lime, MD Primary Cardiologist: None Electrophysiologist: None     Referring Physician: Dr. Criss Alvine (ED)     Jaime Adams is a 62 y.o. female with a history of HTN, aortic regurgitation, and paroxysmal atrial fibrillation who presents for consultation in the [facility].  Recent ED visit on 7/18 due to chest pain x 3 days. She was found to initially be in Afib with RVR and spontaneously converted to NSR ~2 hours later. Patient is on Eliquis 5 mg BID for a CHADS2VASC score of 2.  On evaluation today, she is currently in NSR. Patient tells me when she was in ED in Afib with RVR she did not have cardiac awareness. She is taking Eliquis 5 mg BID. She has stopped all her other current medications temporarily to discuss what she should be taking with her PCP appt on Monday. No bleeding issues on Eliquis.   Today, she denies symptoms of palpitations, chest pain, shortness of breath, orthopnea, PND, lower extremity edema, dizziness, presyncope, syncope, snoring, daytime somnolence, bleeding, or neurologic sequela. The patient is tolerating medications without difficulties and is otherwise without complaint today.    she has a BMI of Body mass index is 18.8 kg/m.Marland Kitchen Filed Weights   01/21/23 1039  Weight: 57.7 kg    Current Outpatient Medications  Medication Sig Dispense Refill   apixaban (ELIQUIS) 5 MG TABS tablet Take 1 tablet (5 mg total) by mouth 2 (two) times daily. 60 tablet 0   amLODipine (NORVASC) 5 MG tablet Take 1 tablet (5 mg total) by mouth daily. (Patient not taking: Reported on 01/21/2023) 30 tablet 11   amoxicillin-clavulanate (AUGMENTIN) 875-125 MG tablet Take 1 tablet by mouth every 12 (twelve) hours. (Patient not taking: Reported on 01/21/2023) 14 tablet 0   benzonatate (TESSALON) 100 MG capsule Take 1 capsule (100 mg total) by mouth every 8 (eight) hours. (Patient not taking: Reported on 01/21/2023) 21 capsule 0    fluticasone (FLONASE) 50 MCG/ACT nasal spray Place 1 spray into both nostrils daily. (Patient not taking: Reported on 01/21/2023) 16 g 0   lidocaine (XYLOCAINE) 2 % solution Use as directed 15 mLs in the mouth or throat as needed (tooth pain). Apply to affected tooth with a Q-tip (Patient not taking: Reported on 01/21/2023) 100 mL 0   loratadine (CLARITIN) 10 MG tablet Take 1 tablet (10 mg total) by mouth daily. (Patient not taking: Reported on 01/21/2023) 30 tablet 11   olopatadine (PATANOL) 0.1 % ophthalmic solution Place 1 drop into both eyes 2 (two) times daily. (Patient not taking: Reported on 01/21/2023) 5 mL 0   rosuvastatin (CRESTOR) 20 MG tablet Take 1 tablet (20 mg total) by mouth daily. (Patient not taking: Reported on 01/21/2023) 30 tablet 11   No current facility-administered medications for this encounter.    Atrial Fibrillation Management history:  Previous antiarrhythmic drugs: None Previous cardioversions: None Previous ablations: None Anticoagulation history: Eliquis   ROS- All systems are reviewed and negative except as per the HPI above.  Physical Exam: BP 120/78   Pulse 75   Ht 5\' 9"  (1.753 m)   Wt 57.7 kg   BMI 18.80 kg/m   GEN: Well nourished, well developed in no acute distress NECK: No JVD; No carotid bruits CARDIAC: Regular rate and rhythm, 3/6 systolic ejection murmur; no rubs or gallops RESPIRATORY:  Clear to auscultation without rales, wheezing or rhonchi  ABDOMEN: Soft, non-tender, non-distended EXTREMITIES:  No edema; No deformity  EKG today demonstrates  Vent. rate 75 BPM PR interval 148 ms QRS duration 66 ms QT/QTcB 378/422 ms P-R-T axes 100 107 122 Suspect arm lead reversal, interpretation assumes no reversal Normal sinus rhythm Anterolateral infarct , age undetermined Abnormal ECG When compared with ECG of 06-Jan-2023 14:38, PREVIOUS ECG IS PRESENT  Echo 03/20/16 demonstrated  Study Conclusions   - Left ventricle: The cavity size was normal.  Wall thickness was    normal. Systolic function was normal. The estimated ejection    fraction was in the range of 55% to 60%. Wall motion was normal;    there were no regional wall motion abnormalities.  - Aortic valve: There was moderate regurgitation. Valve area (VTI):    1.62 cm^2. Valve area (Vmax): 1.79 cm^2. Regurgitation pressure    half-time: 483 ms.  - Mitral valve: There was mild regurgitation.  - Atrial septum: No defect or patent foramen ovale was identified.  - Technically adequate study.   ASSESSMENT & PLAN CHA2DS2-VASc Score = 2  The patient's score is based upon: CHF History: 0 HTN History: 1 Diabetes History: 0 Stroke History: 0 Vascular Disease History: 0 Age Score: 0 Gender Score: 1       ASSESSMENT AND PLAN: Paroxysmal Atrial Fibrillation (ICD10:  I48.0) The patient's CHA2DS2-VASc score is 2, indicating a 2.2% annual risk of stroke.    Education provided about Afib. Discussion about medication treatments and ablation going forward if indicated. After discussion, we will proceed with conservative observation at this time. I will place a 2 week monitor to determine Afib burden.   Previous echocardiogram showed moderate aortic regurgitation. I have a difficult time appreciating a significant diastolic murmur on exam but I do note a systolic ejection murmur that may be previously documented. Given her valvular regurgitation noted in 2017 and new diagnosis of Afib, will order an echocardiogram for update. If her aortic regurgitation is significant, she might benefit from a structural heart team evaluation as could be contributing to her arrhythmia.  She will continue Eliquis for now. Will revisit sleep apnea at next visit. Obtain CBC at next visit.   Follow up 6 weeks to discuss monitor and echo results.    L[place] Bells, PA-C  Afib Clinic [facility] [address] [place], [place] [address] [phone]

## 2023-01-24 ENCOUNTER — Encounter: Payer: Self-pay | Admitting: Student

## 2023-01-24 ENCOUNTER — Ambulatory Visit (INDEPENDENT_AMBULATORY_CARE_PROVIDER_SITE_OTHER): Payer: BLUE CROSS/BLUE SHIELD | Admitting: Student

## 2023-01-24 VITALS — BP 130/64 | HR 74 | Temp 98.3°F | Ht 69.0 in | Wt 125.8 lb

## 2023-01-24 DIAGNOSIS — E785 Hyperlipidemia, unspecified: Secondary | ICD-10-CM

## 2023-01-24 DIAGNOSIS — E7800 Pure hypercholesterolemia, unspecified: Secondary | ICD-10-CM

## 2023-01-24 DIAGNOSIS — I10 Essential (primary) hypertension: Secondary | ICD-10-CM

## 2023-01-24 DIAGNOSIS — I480 Paroxysmal atrial fibrillation: Secondary | ICD-10-CM

## 2023-01-24 MED ORDER — ROSUVASTATIN CALCIUM 20 MG PO TABS
20.0000 mg | ORAL_TABLET | Freq: Every day | ORAL | 11 refills | Status: DC
Start: 2023-01-24 — End: 2024-04-03

## 2023-01-24 NOTE — Progress Notes (Signed)
Established Patient Office Visit  Subjective   Patient ID: Jaime Adams, female    DOB: May 23, 1961  Age: 62 y.o. MRN: [id number]  No chief complaint on file.   HPI  Jaime Adams 62 y.o. comes to follow up on her medications and recent visit to the ED. For the rest of the HPI please refer to the A/P portion of this note.    Past Medical History:  Diagnosis Date   AKI (acute kidney injury) (HCC) 07/08/2022   Cellulitis 03/18/2016   Dehydration 07/08/2022   Facial cellulitis 03/18/2016   High cholesterol    Hypertension    Hyponatremia 07/08/2022   Tobacco use disorder 08/10/2022   Vitamin D deficiency 07/09/2022   No past surgical history on file. Social History   Tobacco Use   Smoking status: Every Day    Current packs/day: 1.00    Average packs/day: 1 pack/day for 20.0 years (20.0 ttl pk-yrs)    Types: Cigarettes   Smokeless tobacco: Never   Tobacco comments:    Sometimes less.  Vaping Use   Vaping status: Never Used  Substance Use Topics   Alcohol use: Not Currently   Drug use: No   Family History  Problem Relation Age of Onset   Hypertension Mother    Alzheimer's disease Mother    Cirrhosis Father    Cancer Maternal Aunt    Heart attack Brother    Stroke Brother    Aneurysm Brother    Diabetes Brother    No Known Allergies    Review of Systems  Constitutional:  Negative for chills and fever.  Cardiovascular:  Positive for chest pain. Negative for palpitations.  Gastrointestinal:  Negative for nausea and vomiting.  Neurological:  Negative for dizziness and loss of consciousness.      Objective:     BP 130/64 (BP Location: Left Arm, Patient Position: Sitting, Cuff Size: Normal)   Pulse 74   Temp 98.3 F (36.8 C) (Oral)   Ht 5\' 9"  (1.753 m)   Wt 125 lb 12.8 oz (57.1 kg)   SpO2 100%   BMI 18.58 kg/m  BP Readings from Last 3 Encounters:  01/24/23 130/64  01/21/23 120/78  01/06/23 (!) 171/90      Physical  Exam Constitutional:      General: She is not in acute distress.    Appearance: She is not ill-appearing.  HENT:     Nose: No congestion or rhinorrhea.     Mouth/Throat:     Mouth: Mucous membranes are moist.  Cardiovascular:     Rate and Rhythm: Normal rate.     Heart sounds: S1 normal and S2 normal. Murmur heard.     No friction rub.  Pulmonary:     Breath sounds: Normal breath sounds. No wheezing, rhonchi or rales.  Abdominal:     General: There is no distension.     Tenderness: There is no abdominal tenderness.  Musculoskeletal:     Right lower leg: No edema.     Left lower leg: No edema.  Neurological:     Mental Status: She is alert.   Last CBC Lab Results  Component Value Date   WBC 8.7 01/06/2023   HGB 13.3 01/06/2023   HCT 39.4 01/06/2023   MCV 95.9 01/06/2023   MCH 32.4 01/06/2023   RDW 13.8 01/06/2023   PLT 239 01/06/2023   Last metabolic panel Lab Results  Component Value Date   GLUCOSE 94 01/06/2023   NA 138  01/06/2023   K 3.8 01/06/2023   CL 106 01/06/2023   CO2 22 01/06/2023   BUN 14 01/06/2023   CREATININE 0.73 01/06/2023   GFRNONAA >60 01/06/2023   CALCIUM 9.3 01/06/2023   PROT 8.8 (H) 01/06/2023   ALBUMIN 4.4 01/06/2023   LABGLOB 3.6 06/16/2022   AGRATIO 1.4 06/16/2022   BILITOT 1.3 (H) 01/06/2023   ALKPHOS 99 01/06/2023   AST 26 01/06/2023   ALT 14 01/06/2023   ANIONGAP 10 01/06/2023      The ASCVD Risk score (Arnett DK, et al., 2019) failed to calculate for the following reasons:   The valid HDL cholesterol range is 20 to 100 mg/dL    Assessment & Plan:   Problem List Items Addressed This Visit       Cardiovascular and Mediastinum   Hypertension    Was on amlodipine 5mg  daily. Reports not on any meds because she would go to a mobile health clinic and BP readings would be WNL per her report. Instructed to monitor BP for this month, and write them down. Come back next month to assess if there will be a need to restart any of her  BP meds. BP 130/64 today.  -record BP readings  -bring readings to office  -FU in 1 mo      Relevant Medications   rosuvastatin (CRESTOR) 20 MG tablet   Paroxysmal atrial fibrillation (HCC) - Primary    Was diagnosed recently during ED visit of 7/18. Has chest pain ongoing for three days and reported a fall over two steps. EGKs showed afib on AVR converted to NSR 2 hours later. Trop WNL. Discharged on apixaban 5mg  daily. Has monitor on, new echo ordered.  -Continue following with Cardiology       Relevant Medications   rosuvastatin (CRESTOR) 20 MG tablet     Other   Hyperlipidemia    Lipid panel from 12/23 had LDL 160. Still not at goal. Resume taking Crestor.       Relevant Medications   rosuvastatin (CRESTOR) 20 MG tablet   Other Visit Diagnoses     Hyperlipidemia LDL goal <100       Relevant Medications   rosuvastatin (CRESTOR) 20 MG tablet       No follow-ups on file.    Seen with Dr. Criselda Peaches.  Manuela Neptune, MD

## 2023-01-24 NOTE — Assessment & Plan Note (Signed)
Was on amlodipine 5mg  daily. Reports not on any meds because she would go to a mobile health clinic and BP readings would be WNL per her report. Instructed to monitor BP for this month, and write them down. Come back next month to assess if there will be a need to restart any of her BP meds. BP 130/64 today.  -record BP readings  -bring readings to office  -FU in 1 mo

## 2023-01-24 NOTE — Patient Instructions (Signed)
Thank you, Ms.Frederico Hamman Warnell for allowing [place] to provide your care today.   I have ordered the following medication/changed the following medications:   Stop the following medications: Medications Discontinued During This Encounter  Medication Reason   amLODipine (NORVASC) 5 MG tablet Patient has not taken in last 30 days   benzonatate (TESSALON) 100 MG capsule Patient has not taken in last 30 days   fluticasone (FLONASE) 50 MCG/ACT nasal spray Patient has not taken in last 30 days   lidocaine (XYLOCAINE) 2 % solution Patient has not taken in last 30 days   loratadine (CLARITIN) 10 MG tablet Patient has not taken in last 30 days   olopatadine (PATANOL) 0.1 % ophthalmic solution Patient has not taken in last 30 days   rosuvastatin (CRESTOR) 20 MG tablet Reorder   amoxicillin-clavulanate (AUGMENTIN) 875-125 MG tablet      Start the following medications: Meds ordered this encounter  Medications   rosuvastatin (CRESTOR) 20 MG tablet    Sig: Take 1 tablet (20 mg total) by mouth daily.    Dispense:  30 tablet    Refill:  11     Follow up:  1 month     Remember: to pick up Crestor from pharmacy and record blood pressure readings from home. Bring them with you to your next visit.   Should you have any questions or concerns please call the internal medicine clinic at [phone].     Manuela Neptune, MD [facility]

## 2023-01-24 NOTE — Assessment & Plan Note (Signed)
Lipid panel from 12/23 had LDL 160. Still not at goal. Resume taking Crestor.

## 2023-01-24 NOTE — Assessment & Plan Note (Signed)
Was diagnosed recently during ED visit of 7/18. Has chest pain ongoing for three days and reported a fall over two steps. EGKs showed afib on AVR converted to NSR 2 hours later. Trop WNL. Discharged on apixaban 5mg  daily. Has monitor on, new echo ordered.  -Continue following with Cardiology

## 2023-01-26 NOTE — Progress Notes (Signed)
Internal Medicine Clinic Attending  I was physically present during the key portions of the resident provided service and participated in the medical decision making of patient's management care. I reviewed pertinent patient test results.  The assessment, diagnosis, and plan were formulated together and I agree with the documentation in the resident's note.  Inez Catalina, MD

## 2023-02-03 DIAGNOSIS — I480 Paroxysmal atrial fibrillation: Secondary | ICD-10-CM | POA: Diagnosis not present

## 2023-02-07 NOTE — Addendum Note (Signed)
Encounter addended by: Shona Simpson, RN on: 02/07/2023 10:26 AM  Actions taken: Imaging Exam ended

## 2023-02-09 ENCOUNTER — Emergency Department (HOSPITAL_COMMUNITY)
Admission: EM | Admit: 2023-02-09 | Discharge: 2023-02-09 | Disposition: A | Discharge status: Home / Self Care | Payer: BLUE CROSS/BLUE SHIELD | Attending: Emergency Medicine | Admitting: Emergency Medicine

## 2023-02-09 ENCOUNTER — Encounter (HOSPITAL_COMMUNITY): Payer: Self-pay

## 2023-02-09 ENCOUNTER — Other Ambulatory Visit: Payer: Self-pay

## 2023-02-09 ENCOUNTER — Other Ambulatory Visit (HOSPITAL_COMMUNITY): Payer: Self-pay | Admitting: *Deleted

## 2023-02-09 DIAGNOSIS — I10 Essential (primary) hypertension: Secondary | ICD-10-CM | POA: Diagnosis not present

## 2023-02-09 DIAGNOSIS — Z7901 Long term (current) use of anticoagulants: Secondary | ICD-10-CM | POA: Diagnosis not present

## 2023-02-09 DIAGNOSIS — Z79899 Other long term (current) drug therapy: Secondary | ICD-10-CM | POA: Diagnosis not present

## 2023-02-09 DIAGNOSIS — Z760 Encounter for issue of repeat prescription: Secondary | ICD-10-CM | POA: Insufficient documentation

## 2023-02-09 MED ORDER — APIXABAN 5 MG PO TABS: 5.0000 mg | ORAL_TABLET | Freq: Two times a day (BID) | ORAL | 0 refills | Status: DC

## 2023-02-09 MED ORDER — APIXABAN 5 MG PO TABS: 5.0000 mg | ORAL_TABLET | Freq: Two times a day (BID) | ORAL | 1 refills | Status: DC

## 2023-02-09 MED ORDER — APIXABAN 5 MG PO TABS
5.0000 mg | ORAL_TABLET | Freq: Two times a day (BID) | ORAL | Status: DC
  Filled 2023-02-09: qty 1

## 2023-02-09 NOTE — Discharge Instructions (Signed)
You were seen in the ER today for a medication refilll of Eliquis. I have gone ahead and sent a refill of this medication to your pharmacy as currently prescribed (1 tablet (5mg  total) twice daily). Please follow up with your cardiologist to ensure this medication is being refilled in a timely manner. Given that you missed your morning dose of Eliquis, a one time dose was given to you here in the ER. If any symptoms develop or for other concerns, return to the ER or follow up with your primary care provider/cardiologist.

## 2023-02-09 NOTE — ED Provider Notes (Signed)
[place] EMERGENCY DEPARTMENT AT [facility] Provider Note   CSN: [id number] Arrival date & time: 02/09/23  1454     History Chief Complaint  Patient presents with   Medication Refill    Jaime Adams is a 62 y.o. female.  Patient with past history significant for hypertension, atrial fibrillation, hyperlipidemia presents to the emergency department concerns of medication refills.  Reports that she was recently prescribed Eliquis for A-fib and has run out of medication.  Was recently seen by cardiology but states that they did not send in refill for her and has not run out of medications.  Currently taking 5 mg twice daily and missed her morning dose.  No recent flareup in palpitations as far as patient has noted.  Denies any symptoms such as shortness of breath, weakness, lightheadedness.   Medication Refill      Home Medications Prior to Admission medications   Medication Sig Start Date End Date Taking? Authorizing Provider  apixaban (ELIQUIS) 5 MG TABS tablet Take 1 tablet (5 mg total) by mouth 2 (two) times daily. 02/09/23 03/11/23 Yes Smitty Knudsen, PA-C  apixaban (ELIQUIS) 5 MG TABS tablet Take 1 tablet (5 mg total) by mouth 2 (two) times daily. 02/09/23 03/11/23  Eustace Pen, PA-C  rosuvastatin (CRESTOR) 20 MG tablet Take 1 tablet (20 mg total) by mouth daily. 01/24/23 01/24/24  Manuela Neptune, MD      Allergies    Patient has no known allergies.    Review of Systems   Review of Systems  Cardiovascular:  Negative for palpitations.  All other systems reviewed and are negative.   Physical Exam Updated Vital Signs BP (!) 149/89 (BP Location: Right Arm)   Pulse 95   Temp 98.2 F (36.8 C) (Oral)   Resp 16   Ht 5\' 9"  (1.753 m)   Wt 57.1 kg   SpO2 100%   BMI 18.58 kg/m  Physical Exam Vitals and nursing note reviewed.  Constitutional:      General: She is not in acute distress.    Appearance: She is well-developed.  HENT:     Head:  Normocephalic and atraumatic.  Eyes:     Conjunctiva/sclera: Conjunctivae normal.  Cardiovascular:     Rate and Rhythm: Normal rate and regular rhythm.     Heart sounds: Murmur heard.     Comments: Currently regularly regular. No obvious prolonged pauses on auscultation. Pulmonary:     Effort: Pulmonary effort is normal. No respiratory distress.     Breath sounds: Normal breath sounds.  Abdominal:     Palpations: Abdomen is soft.     Tenderness: There is no abdominal tenderness.  Musculoskeletal:        General: No swelling.     Cervical back: Neck supple.  Skin:    General: Skin is warm and dry.     Capillary Refill: Capillary refill takes less than 2 seconds.  Neurological:     Mental Status: She is alert.  Psychiatric:        Mood and Affect: Mood normal.     ED Results / Procedures / Treatments   Labs (all labs ordered are listed, but only abnormal results are displayed) Labs Reviewed - No data to display  EKG None  Radiology No results found.  Procedures Procedures   Medications Ordered in ED Medications  apixaban (ELIQUIS) tablet 5 mg (has no administration in time range)    ED Course/ Medical Decision Making/ A&P  Medical Decision Making Risk Prescription drug management.   This patient presents to the ED for concern of medication refill. Differential diagnosis includes atrial fibrillation, ACS, MI, stroke   Medicines ordered and prescription drug management:  I ordered medication including ELiquis for atrial fibrillation  Reevaluation of the patient after these medicines showed that the patient stayed the same I have reviewed the patients home medicines and have made adjustments as needed   Problem List / ED Course:  Patient presents to the emergency department concerns of needing a medication refill.  Recently diagnosed with atrial fibrillation and started on Eliquis.  Was seen by cardiology about 2 weeks ago but  states that they did not refill her Eliquis at that time.  Was unable to get a hold of her cardiologist office for Eliquis refills and missed her morning dose.  Currently takes 5 mg twice daily.  Denies any chest pain, palpitations, or shortness of breath. Given missed morning dose, will order single dose of 5mg  Eliquis with prescription refill sent to patient's pharmacy. Patient advised to reach out to cardiologist to ensure that her Eliquis is being refilled in a timely manner to avoid lapses and doses. Patient verbalized understanding treatment plan and strict precautions and verbalized understanding these. Given lack of symptoms and no acute evidence of recurrence of a-fib, do not believe for workup needed at this time, however did advise patient to return to the ER if symptoms are worsening. All questions answered prior to patient discharge.   Final Clinical Impression(s) / ED Diagnoses Final diagnoses:  Encounter for medication refill    Rx / DC Orders ED Discharge Orders          Ordered    apixaban (ELIQUIS) 5 MG TABS tablet  2 times daily        02/09/23 1531              Smitty Knudsen, PA-C 02/09/23 1540    Gloris Manchester, MD 02/09/23 2342

## 2023-02-09 NOTE — ED Triage Notes (Signed)
Pt seen recently and was prescribed Eliquis for afib. Pt has run out and needs a refill.

## 2023-02-14 ENCOUNTER — Ambulatory Visit (HOSPITAL_COMMUNITY)
Admission: RE | Admit: 2023-02-14 | Discharge: 2023-02-14 | Disposition: A | Discharge status: Home / Self Care | Payer: BLUE CROSS/BLUE SHIELD | Source: Ambulatory Visit | Attending: Internal Medicine | Admitting: Internal Medicine

## 2023-02-14 DIAGNOSIS — I480 Paroxysmal atrial fibrillation: Secondary | ICD-10-CM

## 2023-02-14 DIAGNOSIS — I10 Essential (primary) hypertension: Secondary | ICD-10-CM | POA: Insufficient documentation

## 2023-02-14 DIAGNOSIS — I080 Rheumatic disorders of both mitral and aortic valves: Secondary | ICD-10-CM | POA: Insufficient documentation

## 2023-02-14 DIAGNOSIS — F17200 Nicotine dependence, unspecified, uncomplicated: Secondary | ICD-10-CM | POA: Insufficient documentation

## 2023-02-14 DIAGNOSIS — I4891 Unspecified atrial fibrillation: Secondary | ICD-10-CM | POA: Insufficient documentation

## 2023-02-14 NOTE — Progress Notes (Signed)
Echocardiogram 2D Echocardiogram has been performed.  Jaime Adams 02/14/2023, 12:13 PM

## 2023-02-18 ENCOUNTER — Encounter (HOSPITAL_COMMUNITY): Payer: Self-pay | Admitting: *Deleted

## 2023-02-18 ENCOUNTER — Telehealth: Payer: Self-pay

## 2023-02-18 NOTE — Telephone Encounter (Signed)
Transition Care Management Unsuccessful Follow-up Telephone Call  Date of discharge and from where:  [facility] 8[facility]  Attempts:  1st Attempt  Reason for unsuccessful TCM follow-up call:  No answer/busy   Lenard Forth [place]  [facility], [facility] Guide, Phone: [phone] Website: [email or web address]

## 2023-02-24 ENCOUNTER — Encounter: Payer: BLUE CROSS/BLUE SHIELD | Admitting: Student

## 2023-03-04 ENCOUNTER — Ambulatory Visit (HOSPITAL_COMMUNITY)
Admission: RE | Admit: 2023-03-04 | Discharge: 2023-03-04 | Disposition: A | Discharge status: Home / Self Care | Payer: BLUE CROSS/BLUE SHIELD | Source: Ambulatory Visit | Attending: Internal Medicine | Admitting: Internal Medicine

## 2023-03-04 VITALS — BP 150/76 | HR 84 | Ht 69.0 in | Wt 126.2 lb

## 2023-03-04 DIAGNOSIS — I10 Essential (primary) hypertension: Secondary | ICD-10-CM | POA: Diagnosis not present

## 2023-03-04 DIAGNOSIS — Z7901 Long term (current) use of anticoagulants: Secondary | ICD-10-CM | POA: Diagnosis not present

## 2023-03-04 DIAGNOSIS — I480 Paroxysmal atrial fibrillation: Secondary | ICD-10-CM | POA: Diagnosis present

## 2023-03-04 DIAGNOSIS — I351 Nonrheumatic aortic (valve) insufficiency: Secondary | ICD-10-CM | POA: Insufficient documentation

## 2023-03-04 LAB — CBC
HCT: 28 % — ABNORMAL LOW (ref 36.0–46.0)
Hemoglobin: 9.4 g/dL — ABNORMAL LOW (ref 12.0–15.0)
MCH: 31.8 pg (ref 26.0–34.0)
MCHC: 33.6 g/dL (ref 30.0–36.0)
MCV: 94.6 fL (ref 80.0–100.0)
Platelets: 197 10*3/uL (ref 150–400)
RBC: 2.96 MIL/uL — ABNORMAL LOW (ref 3.87–5.11)
RDW: 15.5 % (ref 11.5–15.5)
WBC: 5.1 10*3/uL (ref 4.0–10.5)
nRBC: 0 % (ref 0.0–0.2)

## 2023-03-04 NOTE — Progress Notes (Addendum)
Primary Care Physician: Jaime Lime, MD Primary Cardiologist: None Electrophysiologist: None     Referring Physician: Dr. Criss Adams (ED)     Jaime Adams is a 62 y.o. female with a history of HTN, aortic regurgitation, and paroxysmal atrial fibrillation who presents for consultation in the [facility].  Recent ED visit on 7/18 due to chest pain x 3 days. She was found to initially be in Afib with RVR and spontaneously converted to NSR ~2 hours later. Patient is on Eliquis 5 mg BID for a CHADS2VASC score of 2.  On evaluation today, she is currently in NSR. Patient tells me when she was in ED in Afib with RVR she did not have cardiac awareness. She is taking Eliquis 5 mg BID. She has stopped all her other current medications temporarily to discuss what she should be taking with her PCP appt on Monday. No bleeding issues on Eliquis.   On follow up 03/04/23, she is currently in NSR. She has had no episodes of Afib since last OV. Cardiac monitor showed predominantly sinus rhythm with less than 1% ectopy. She has no bleeding issues on Eliquis. She notes her mother has cardiac history and her brother is on a blood thinner with a history of stroke. She has been told she snores sometimes.   Today, she denies symptoms of palpitations, chest pain, shortness of breath, orthopnea, PND, lower extremity edema, dizziness, presyncope, syncope, snoring, daytime somnolence, bleeding, or neurologic sequela. The patient is tolerating medications without difficulties and is otherwise without complaint today.    she has a BMI of Body mass index is 18.64 kg/m.Marland Kitchen Filed Weights   03/04/23 0953  Weight: 57.2 kg     Current Outpatient Medications  Medication Sig Dispense Refill   apixaban (ELIQUIS) 5 MG TABS tablet Take 1 tablet (5 mg total) by mouth 2 (two) times daily. 180 tablet 1   rosuvastatin (CRESTOR) 20 MG tablet Take 1 tablet (20 mg total) by mouth daily. 30 tablet  11   apixaban (ELIQUIS) 5 MG TABS tablet Take 1 tablet (5 mg total) by mouth 2 (two) times daily. (Patient not taking: Reported on 03/04/2023) 60 tablet 0   No current facility-administered medications for this encounter.    Atrial Fibrillation Management history:  Previous antiarrhythmic drugs: None Previous cardioversions: None Previous ablations: None Anticoagulation history: Eliquis   ROS- All systems are reviewed and negative except as per the HPI above.  Physical Exam: BP (!) 150/76   Pulse 84   Ht 5\' 9"  (1.753 m)   Wt 57.2 kg   BMI 18.64 kg/m   GEN- The patient is well appearing, alert and oriented x 3 today.   Neck - no JVD or carotid bruit noted Lungs- Clear to ausculation bilaterally, normal work of breathing Heart- Regular rate and rhythm, 3/6 systolic ejection murmur; no rubs or gallops, PMI not laterally displaced Extremities- no clubbing, cyanosis, or edema Skin - no rash or ecchymosis noted   EKG today demonstrates  Vent. rate 84 BPM PR interval 166 ms QRS duration 68 ms QT/QTcB 362/427 ms P-R-T axes 36 -12 -6 Normal sinus rhythm Minimal voltage criteria for LVH, may be normal variant ( R in aVL ) Inferior infarct , age undetermined Abnormal ECG When compared with ECG of 21-Jan-2023 10:49, PREVIOUS ECG IS PRESENT  Echo 02/14/23:  1. Left ventricular ejection fraction, by estimation, is 60 to 65%. Left  ventricular ejection fraction by 3D volume is 63 %. The left  ventricle has  normal function. The left ventricle has no regional wall motion  abnormalities. Left ventricular diastolic   parameters are consistent with Grade I diastolic dysfunction (impaired  relaxation). The average left ventricular global longitudinal strain is  -20.3 %. The global longitudinal strain is normal.   2. Right ventricular systolic function is normal. The right ventricular  size is normal.   3. The mitral valve is normal in structure. Mild mitral valve  regurgitation. No  evidence of mitral stenosis.   4. The aortic valve is normal in structure. Aortic valve regurgitation is  moderate. No aortic stenosis is present.   5. The inferior vena cava is normal in size with greater than 50%  respiratory variability, suggesting right atrial pressure of 3 mmHg.   Cardiac monitor 01/2023: Patch Wear Time:  9 days and 13 hours   Patient had a min HR of 54 bpm, max HR of 160 bpm, and avg HR of 72 bpm.  Predominant underlying rhythm was Sinus Rhythm.  Less than 1% ventricular and supraventricular ectopy Patient triggered episodes associated with sinus rhythm and sinus tachycardia   Jaime Camnitz, MD  ASSESSMENT & PLAN CHA2DS2-VASc Score = 2  The patient's score is based upon: CHF History: 0 HTN History: 1 Diabetes History: 0 Stroke History: 0 Vascular Disease History: 0 Age Score: 0 Gender Score: 1      ASSESSMENT AND PLAN: Paroxysmal Atrial Fibrillation (ICD10:  I48.0) The patient's CHA2DS2-VASc score is 2, indicating a 2.2% annual risk of stroke.    She is currently in NSR. Cardiac monitor did not show any Afib. We Jaime proceed with conservative observation at this time. Rhythm monitoring device recommended.    Discussion of echocardiogram results and that at this time would be surveillance of findings. Due to these findings and her mother's cardiac history, patient inquired about establishing with general Cardiology. I Jaime refer on patient's behalf. She declines sleep study for now.   Discussion of her risk score and anticoagulation - she would prefer to stay on anticoagulation. This is reasonable given her elevated risk. CBC drawn today.    Follow up 6 months Afib clinic.   Jaime Bells, PA-C  Afib Clinic [facility] [address] [place], [place] [address] [phone]

## 2023-03-04 NOTE — Patient Instructions (Signed)
[facility]

## 2023-03-08 ENCOUNTER — Encounter: Payer: BLUE CROSS/BLUE SHIELD | Admitting: Student

## 2023-03-08 NOTE — Progress Notes (Deleted)
   CC: ***  HPI:  [name] is a 62 y.o. female with a past medical history of hypertension, paroxysmal atrial fibrillation, hyperlipidemia presenting for follow-up appointment.  Please see assessment and plan for full HPI.  Medications: Paroxysmal atrial fibrillation: Apixaban 5 mg twice daily Hyperlipidemia: Crestor 20 mg daily  Most recently seen in the office on 01/24/2023: At that time patient had talks about hypertension and patient was off of amlodipine.  Patient did not take her medications.  Patient is followed by a A-fib clinic.  03/04/2023 CBC showing hemoglobin 9.4, hematocrit 28, MCV 94.6  Ask about bleeding.  Past Medical History:  Diagnosis Date   AKI (acute kidney injury) (HCC) 07/08/2022   Cellulitis 03/18/2016   Dehydration 07/08/2022   Facial cellulitis 03/18/2016   High cholesterol    Hypertension    Hyponatremia 07/08/2022   Tobacco use disorder 08/10/2022   Vitamin D deficiency 07/09/2022     Current Outpatient Medications:    apixaban (ELIQUIS) 5 MG TABS tablet, Take 1 tablet (5 mg total) by mouth 2 (two) times daily., Disp: 180 tablet, Rfl: 1   apixaban (ELIQUIS) 5 MG TABS tablet, Take 1 tablet (5 mg total) by mouth 2 (two) times daily. (Patient not taking: Reported on 03/04/2023), Disp: 60 tablet, Rfl: 0   rosuvastatin (CRESTOR) 20 MG tablet, Take 1 tablet (20 mg total) by mouth daily., Disp: 30 tablet, Rfl: 11  Review of Systems:  ***  Constitutional: Eye: Respiratory: Cardiovascular: GI: MSK: GU: Skin: Neuro: Endocrine:   Physical Exam:  There were no vitals filed for this visit. *** General: Patient is sitting comfortably in the room  Eyes: Pupils equal and reactive to light, EOM intact  Head: Normocephalic, atraumatic  Neck: Supple, nontender, full range of motion, No JVD Cardio: Regular rate and rhythm, no murmurs, rubs or gallops. 2+ pulses to bilateral upper and lower extremities  Chest: No chest  tenderness Pulmonary: Clear to ausculation bilaterally with no rales, rhonchi, and crackles  Abdomen: Soft, nontender with normoactive bowel sounds with no rebound or guarding  Neuro: Alert and orientated x3. CN II-XII intact. Sensation intact to upper and lower extremities. 2+ patellar reflex.  Back: No midline tenderness, no step off or deformities noted. No paraspinal muscle tenderness.  Skin: No rashes noted  MSK: 5/5 strength to upper and lower extremities.    Assessment & Plan:   No problem-specific Assessment & Plan notes found for this encounter.    Patient {GC/GE:[id number]::"discussed with","seen with"} Dr. {NAMES:[id number]::"Guilloud","Hoffman","Mullen","Narendra","Williams","Vincent"}  Modena Slater, DO PGY-2 Internal Medicine Resident  Pager: [phone]

## 2023-03-25 ENCOUNTER — Encounter (HOSPITAL_COMMUNITY): Payer: Self-pay | Admitting: *Deleted

## 2023-05-02 ENCOUNTER — Encounter: Payer: BLUE CROSS/BLUE SHIELD | Admitting: Student

## 2023-09-02 ENCOUNTER — Ambulatory Visit (HOSPITAL_COMMUNITY)
Admission: RE | Admit: 2023-09-02 | Discharge: 2023-09-02 | Disposition: A | Discharge status: Home / Self Care | Payer: BLUE CROSS/BLUE SHIELD | Source: Ambulatory Visit | Attending: Internal Medicine | Admitting: Internal Medicine

## 2023-09-02 VITALS — BP 112/66 | HR 92 | Ht 69.0 in | Wt 120.6 lb

## 2023-09-02 DIAGNOSIS — I480 Paroxysmal atrial fibrillation: Secondary | ICD-10-CM | POA: Diagnosis present

## 2023-09-02 DIAGNOSIS — I10 Essential (primary) hypertension: Secondary | ICD-10-CM | POA: Diagnosis not present

## 2023-09-02 DIAGNOSIS — I351 Nonrheumatic aortic (valve) insufficiency: Secondary | ICD-10-CM | POA: Diagnosis not present

## 2023-09-02 NOTE — Progress Notes (Signed)
 Primary Care Physician: Kathleen Lime, MD Primary Cardiologist: None Electrophysiologist: None     Referring Physician: Dr. Criss Alvine (ED)     Jaime Adams is a 63 y.o. female with a history of HTN, aortic regurgitation, and paroxysmal atrial fibrillation who presents for consultation in the [facility].  Recent ED visit on 7/18 due to chest pain x 3 days. She was found to initially be in Afib with RVR and spontaneously converted to NSR ~2 hours later. Patient is on Eliquis 5 mg BID for a CHADS2VASC score of 2.  On evaluation today, she is currently in NSR. Patient tells me when she was in ED in Afib with RVR she did not have cardiac awareness. She is taking Eliquis 5 mg BID. She has stopped all her other current medications temporarily to discuss what she should be taking with her PCP appt on Monday. No bleeding issues on Eliquis.   On follow up 03/04/23, she is currently in NSR. She has had no episodes of Afib since last OV. Cardiac monitor showed predominantly sinus rhythm with less than 1% ectopy. She has no bleeding issues on Eliquis. She notes her mother has cardiac history and her brother is on a blood thinner with a history of stroke. She has been told she snores sometimes.   On follow up 09/02/23, she is currently in NSR. She has had no episodes of Afib since last office visit. She is not on anticoagulation due to low risk score.   Today, she denies symptoms of palpitations, chest pain, shortness of breath, orthopnea, PND, lower extremity edema, dizziness, presyncope, syncope, snoring, daytime somnolence, bleeding, or neurologic sequela. The patient is tolerating medications without difficulties and is otherwise without complaint today.    she has a BMI of Body mass index is 17.81 kg/m.Marland Kitchen Filed Weights   09/02/23 1008  Weight: 54.7 kg      Current Outpatient Medications  Medication Sig Dispense Refill   chlorhexidine (PERIDEX) 0.12 %  solution FILL CAP TO FILL LINE ( ) SWISH IN MOUTH FOR 30 SECONDS THEN SPIT OUT. USE AFTER BREAKFAST AND BEFORE BEDTIME     ibuprofen (ADVIL) 800 MG tablet Take 800 mg by mouth as needed.     rosuvastatin (CRESTOR) 20 MG tablet Take 1 tablet (20 mg total) by mouth daily. 30 tablet 11   No current facility-administered medications for this encounter.    Atrial Fibrillation Management history:  Previous antiarrhythmic drugs: None Previous cardioversions: None Previous ablations: None Anticoagulation history: Eliquis   ROS- All systems are reviewed and negative except as per the HPI above.  Physical Exam: BP 112/66   Pulse 92   Ht 5\' 9"  (1.753 m)   Wt 54.7 kg   BMI 17.81 kg/m   GEN- The patient is well appearing, alert and oriented x 3 today.   Neck - no JVD or carotid bruit noted Lungs- Clear to ausculation bilaterally, normal work of breathing Heart- Regular rate and rhythm, no murmurs, rubs or gallops, PMI not laterally displaced Extremities- no clubbing, cyanosis, or edema Skin - no rash or ecchymosis noted   EKG today demonstrates  Vent. rate 92 BPM PR interval 178 ms QRS duration 72 ms QT/QTcB 362/447 ms P-R-T axes 77 73 74 Sinus rhythm with Premature atrial complexes Septal infarct , age undetermined Abnormal ECG When compared with ECG of 04-Mar-2023 10:00, PREVIOUS ECG IS PRESENT  Echo 02/14/23:  1. Left ventricular ejection fraction, by estimation, is 60 to 65%. Left  ventricular ejection fraction by 3D volume is 63 %. The left ventricle has  normal function. The left ventricle has no regional wall motion  abnormalities. Left ventricular diastolic   parameters are consistent with Grade I diastolic dysfunction (impaired  relaxation). The average left ventricular global longitudinal strain is  -20.3 %. The global longitudinal strain is normal.   2. Right ventricular systolic function is normal. The right ventricular  size is normal.   3. The mitral valve is  normal in structure. Mild mitral valve  regurgitation. No evidence of mitral stenosis.   4. The aortic valve is normal in structure. Aortic valve regurgitation is  moderate. No aortic stenosis is present.   5. The inferior vena cava is normal in size with greater than 50%  respiratory variability, suggesting right atrial pressure of 3 mmHg.   Cardiac monitor 01/2023: Patch Wear Time:  9 days and 13 hours   Patient had a min HR of 54 bpm, max HR of 160 bpm, and avg HR of 72 bpm.  Predominant underlying rhythm was Sinus Rhythm.  Less than 1% ventricular and supraventricular ectopy Patient triggered episodes associated with sinus rhythm and sinus tachycardia   Will Camnitz, MD  ASSESSMENT & PLAN CHA2DS2-VASc Score = 2  The patient's score is based upon: CHF History: 0 HTN History: 1 Diabetes History: 0 Stroke History: 0 Vascular Disease History: 0 Age Score: 0 Gender Score: 1      ASSESSMENT AND PLAN: Paroxysmal Atrial Fibrillation (ICD10:  I48.0) The patient's CHA2DS2-VASc score is 2, indicating a 2.2% annual risk of stroke.    She is currently in NSR. She has what appears to be very low burden overall. She is not on anticoagulation due to low risk score. Continue conservative observation.  Follow up Afib clinic prn.   Jaime Bells, PA-C  [facility] [address] [place], [place] [address] [phone]

## 2024-03-29 ENCOUNTER — Ambulatory Visit: Payer: Self-pay

## 2024-03-29 NOTE — Telephone Encounter (Signed)
 FYI Only or Action Required?: FYI only for provider.  Patient was last seen in primary care on 01/24/2023 by Volney Leash, MD.  Called Nurse Triage reporting Vomiting.  Symptoms began several months ago.  Interventions attempted: Rest, hydration, or home remedies.  Symptoms are: unchanged.  Triage Disposition: See PCP Within 2 Weeks  Patient/caregiver understands and will follow disposition?: Yes  Copied from CRM [id number]. Topic: Clinical - Red Word Triage >> Mar 29, 2024  1:18 PM Susanna ORN wrote: Red Word that prompted transfer to Nurse Triage: Patient's daughter, Jarika Robben, calling in to schedule appt for her mother with PCP. States her mom has been vomiting whenever she eats and throwing her food up. States this has been ongoing for months and she's gotten really small. Rexene also states her mom can barely walk and has to pick her legs up to get into the car. Reason for Disposition  Vomiting is a chronic symptom (recurrent or ongoing AND present > 4 weeks)  Answer Assessment - Initial Assessment Questions 1. VOMITING SEVERITY: How many times have you vomited in the past 24 hours?      Daughter states she is vomiting every time she eats.  2. ONSET: When did the vomiting begin?      Going on for several months 3. FLUIDS: What fluids or food have you vomited up today? Have you been able to keep any fluids down?     Patient reports drinking Ensure. Heavy food is causing patient to vomit 4. ABDOMEN PAIN: Are your having any abdomen pain? If Yes : How bad is it and what does it feel like? (e.g., crampy, dull, intermittent, constant)      no 5. DIARRHEA: Is there any diarrhea? If Yes, ask: How many times today?      no 6. CONTACTS: Is there anyone else in the family with the same symptoms?      no 7. CAUSE: What do you think is causing your vomiting?     unsure 8. HYDRATION STATUS: Any signs of dehydration? (e.g., dry mouth [not only dry lips], too  weak to stand) When did you last urinate?     No-reports urinating normally.  9. OTHER SYMPTOMS: Do you have any other symptoms? (e.g., fever, headache, vertigo, vomiting blood or coffee grounds, recent head injury)     Weakness, headache  Protocols used: Vomiting-A-AH

## 2024-03-29 NOTE — Telephone Encounter (Signed)
 Called pt who stated she feels fine and only throw up water. So I called her daughter, Rexene, who stated pt throws up solid foods and has her drinking Ensure. I  asked if pt is able to keep anything down; she stated beer and alcohol. And pt has loss a lot of weight. Offered appt for tomorrow; unable to bring her mother. Also on Monday - stated she will call back if she can bring her mother. Appt re-scheduled Monday 10/13 with Dr Myrna.

## 2024-04-02 ENCOUNTER — Other Ambulatory Visit: Payer: Self-pay

## 2024-04-02 ENCOUNTER — Ambulatory Visit: Payer: Self-pay

## 2024-04-02 VITALS — BP 131/68 | HR 101 | Temp 98.5°F | Ht 69.0 in | Wt 111.8 lb

## 2024-04-02 DIAGNOSIS — F1010 Alcohol abuse, uncomplicated: Secondary | ICD-10-CM

## 2024-04-02 DIAGNOSIS — R5382 Chronic fatigue, unspecified: Secondary | ICD-10-CM

## 2024-04-02 DIAGNOSIS — Z8249 Family history of ischemic heart disease and other diseases of the circulatory system: Secondary | ICD-10-CM

## 2024-04-02 DIAGNOSIS — F17210 Nicotine dependence, cigarettes, uncomplicated: Secondary | ICD-10-CM | POA: Diagnosis not present

## 2024-04-02 DIAGNOSIS — R6881 Early satiety: Secondary | ICD-10-CM

## 2024-04-02 DIAGNOSIS — F17200 Nicotine dependence, unspecified, uncomplicated: Secondary | ICD-10-CM

## 2024-04-02 DIAGNOSIS — G629 Polyneuropathy, unspecified: Secondary | ICD-10-CM | POA: Diagnosis present

## 2024-04-02 MED ORDER — GABAPENTIN 100 MG PO CAPS: 100.0000 mg | ORAL_CAPSULE | Freq: Three times a day (TID) | ORAL | 2 refills | Status: AC

## 2024-04-02 NOTE — Patient Instructions (Signed)
 Thank you, Ms.Kirtland Shona Muss, for allowing us  to provide your care today. Today we discussed . . .  > Burning pain       - This is called neuropathy. It could be due to your alcohol use. We have checked labs today to help determine the cause       - We have also started Gabapentin 100mg --take 1 capsule as needed up to 3 times per day for pain  > Nausea and feeling full       - We have referred you to the stomach doctors to further evaluate and we have checked labs on you today to help determine the cause > Smoking        - We have ordered a low dose CT scan as a screening for your lungs.    I have ordered the following labs for you:   Lab Orders         Comprehensive metabolic panel with GFR         Hemoglobin A1c         B12 and Folate Panel         TSH         Lipase         CBC no Diff       Referrals ordered today:    Referral Orders         Ambulatory referral to Gastroenterology       Follow up: 4 weeks    Remember:  Should you have any questions or concerns please call the internal medicine clinic at [phone].     Schuyler Novak, DO [facility]

## 2024-04-02 NOTE — Progress Notes (Unsigned)
 Acute Office Visit  Subjective:     Patient ID: Jaime Adams, female    DOB: 26-Sep-1960, 63 y.o.   MRN: [id number]  Chief Complaint  Patient presents with   Acute Visit    PER E2C2  VOMITING  Vomiting.   Symptoms began several months ago.   Interventions attempted: Rest, hydration, or home remedies.   Symptoms are: unchanged.     Jaime Adams is a 63 year old female with a past medical history of hypertension, hyperlipidemia, A-fib, and alcohol use disorder who presents today for evaluation of frequent vomiting..  See problem-based assessment plan below.   Review of Systems  Constitutional:  Positive for weight loss. Negative for chills and fever.  Eyes:  Negative for blurred vision and double vision.  Respiratory:  Negative for hemoptysis, shortness of breath and wheezing.   Cardiovascular:  Negative for chest pain, palpitations and leg swelling.  Gastrointestinal:  Positive for diarrhea, nausea and vomiting. Negative for abdominal pain and heartburn.  Neurological:  Positive for tingling and sensory change. Negative for dizziness, focal weakness and headaches.        Objective:    There were no vitals taken for this visit.    Const: Awake, alert in NAD, thin appearing HENT: Normocephalic, atraumatic, mucus membranes moist Card: RRR, No MRG, No pitting edema on LE's bilaterally  Resp: LCTAB, no increased work of breathing Abd: Soft, NTND, Bsx4 Extremities: Warm, pink  No results found for any visits on 04/02/24.      Assessment & Plan:   Assessment & Plan Neuropathy The patient is complaining of neuropathy today.  Reported that her lower extremities feel like they are on fire.  It does come and go but it has been constant for the last week and is very bothersome to her.  During the visit we did obtain an A1c, B12, and TSH.  These did come back within normal limits and are likely not explanation for her neuropathy.  I suspect at this time that her  neuropathy is secondary to chronic alcohol use over the past few decades.  Would strongly advise her to stop drinking alcohol.  Will also suggest a multivitamin and thiamine as she does not have plans at this time to decrease alcohol intake. I did prescribe gabapentin 100 mg 3 times per day for symptomatic relief. Orders:   Comprehensive metabolic panel with GFR   Hemoglobin A1c   B12 and Folate Panel   TSH  Early satiety Patient's primary complaint today is early satiety with associated nausea occasionally.  She reports that she cannot eat very much without feeling full immediately and she is having diarrhea and occasional nausea.  She reports that she cannot eat regularly without having stomach pain and discomfort.  Due to chronic alcohol use, this could be some gastroparesis or chronic pancreatitis with pancreatic insufficiency.  Will obtain lipase and refer her to gastroenterology for an EGD to rule out further causes. Orders:   Lipase   Ambulatory referral to Gastroenterology  Chronic fatigue The patient is complaining of persistent fatigue and often needing to nap.  DBC obtained this visit showed hemoglobin of 9 and slightly elevated MCV at 98.  Suspect this anemia is secondary to bone marrow suppression in the setting of alcohol use. Orders:   CBC no Diff  Tobacco use disorder At the patient does have a decades long history of tobacco use disorder (25-pack-year history) and is complaining of weight loss with increased fatigue, would suggest a low-dose CT  for malignancy screening.  Discussed this with the patient as well as the reasoning for the exam and she did verbalize understanding and is agreeable to the scan. Orders:   CT CHEST LUNG CA SCREEN LOW DOSE W/O CM; Future   Schuyler Novak, DO

## 2024-04-03 ENCOUNTER — Other Ambulatory Visit: Payer: Self-pay | Admitting: Student

## 2024-04-03 DIAGNOSIS — E785 Hyperlipidemia, unspecified: Secondary | ICD-10-CM

## 2024-04-03 NOTE — Telephone Encounter (Unsigned)
 Copied from CRM [id number]. Topic: Clinical - Medication Refill >> Apr 03, 2024  3:31 PM Diannia H wrote: Medication: rosuvastatin  (CRESTOR ) 20 MG tablet  Has the patient contacted their pharmacy? Yes (Agent: If no, request that the patient contact the pharmacy for the refill. If patient does not wish to contact the pharmacy document the reason why and proceed with request.) (Agent: If yes, when and what did the pharmacy advise?)  This is the patient's preferred pharmacy:  [facility] [address] [place] - [address] [place] [address] Phone: [phone] Fax: [phone]  Is this the correct pharmacy for this prescription? Yes If no, delete pharmacy and type the correct one.   Has the prescription been filled recently? No  Is the patient out of the medication? No  Has the patient been seen for an appointment in the last year OR does the patient have an upcoming appointment? Yes  Can we respond through MyChart? Yes  Agent: Please be advised that Rx refills may take up to 3 business days. We ask that you follow-up with your pharmacy.

## 2024-04-04 ENCOUNTER — Ambulatory Visit: Payer: Self-pay | Admitting: Student

## 2024-04-04 ENCOUNTER — Telehealth: Payer: Self-pay | Admitting: *Deleted

## 2024-04-04 LAB — LIPASE: Lipase: 42 U/L (ref 14–72)

## 2024-04-04 LAB — CBC
Hematocrit: 27.7 % — ABNORMAL LOW (ref 34.0–46.6)
Hemoglobin: 9 g/dL — ABNORMAL LOW (ref 11.1–15.9)
MCH: 31.7 pg (ref 26.6–33.0)
MCHC: 32.5 g/dL (ref 31.5–35.7)
MCV: 98 fL — ABNORMAL HIGH (ref 79–97)
Platelets: 244 x10E3/uL (ref 150–450)
RBC: 2.84 x10E6/uL — ABNORMAL LOW (ref 3.77–5.28)
RDW: 16.4 % — ABNORMAL HIGH (ref 11.7–15.4)
WBC: 4.4 x10E3/uL (ref 3.4–10.8)

## 2024-04-04 LAB — COMPREHENSIVE METABOLIC PANEL WITH GFR
ALT: 19 IU/L (ref 0–32)
AST: 56 IU/L — AB (ref 0–40)
Albumin: 3.4 g/dL — AB (ref 3.9–4.9)
Alkaline Phosphatase: 130 IU/L (ref 49–135)
BUN/Creatinine Ratio: 9 — AB (ref 12–28)
BUN: 7 mg/dL — AB (ref 8–27)
Bilirubin Total: 0.3 mg/dL (ref 0.0–1.2)
CO2: 18 mmol/L — AB (ref 20–29)
Calcium: 8.5 mg/dL — AB (ref 8.7–10.3)
Chloride: 106 mmol/L (ref 96–106)
Creatinine, Ser: 0.79 mg/dL (ref 0.57–1.00)
Globulin, Total: 3.2 g/dL (ref 1.5–4.5)
Glucose: 72 mg/dL (ref 70–99)
Potassium: 3.4 mmol/L — AB (ref 3.5–5.2)
Sodium: 144 mmol/L (ref 134–144)
Total Protein: 6.6 g/dL (ref 6.0–8.5)
eGFR: 83 mL/min/1.73 (ref >59)

## 2024-04-04 LAB — HEMOGLOBIN A1C
Est. average glucose Bld gHb Est-mCnc: 91 mg/dL
Hgb A1c MFr Bld: 4.8 % (ref 4.8–5.6)

## 2024-04-04 LAB — B12 AND FOLATE PANEL
Folate: 3.3 ng/mL (ref >3.0)
Vitamin B-12: 782 pg/mL (ref 232–1245)

## 2024-04-04 LAB — TSH: TSH: 0.571 u[IU]/mL (ref 0.450–4.500)

## 2024-04-04 MED ORDER — ROSUVASTATIN CALCIUM 20 MG PO TABS
20.0000 mg | ORAL_TABLET | Freq: Every day | ORAL | 11 refills | Status: AC
Start: 2024-04-04 — End: 2025-04-04

## 2024-04-04 NOTE — Telephone Encounter (Signed)
 Copied from CRM #[id number]. Topic: Clinical - Lab/Test Results >> Apr 04, 2024 11:09 AM Jaime Adams wrote: Reason for CRM: Patient is calling in for recent lab work results, please contact the patient with an update.

## 2024-04-05 ENCOUNTER — Ambulatory Visit: Payer: Self-pay

## 2024-04-05 NOTE — Progress Notes (Signed)
 TSH, B-12, and A1c all within normal limits, likely not explanations for her lower extremity neuropathy.  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

## 2024-04-05 NOTE — Telephone Encounter (Signed)
 Patient is calling regarding lab results, I informed her that she will get a call tomorrow.

## 2024-04-05 NOTE — Assessment & Plan Note (Signed)
 At the patient does have a decades long history of tobacco use disorder (25-pack-year history) and is complaining of weight loss with increased fatigue, would suggest a low-dose CT for malignancy screening.  Discussed this with the patient as well as the reasoning for the exam and she did verbalize understanding and is agreeable to the scan. Orders:   CT CHEST LUNG CA SCREEN LOW DOSE W/O CM; Future

## 2024-04-06 NOTE — Progress Notes (Signed)
 Internal Medicine Clinic Attending  I was physically present during the key portions of the resident provided service and participated in the medical decision making of patient's management care. I reviewed pertinent patient test results.  The assessment, diagnosis, and plan were formulated together and I agree with the documentation in the resident's note.  Francesco Elsie NOVAK, MD

## 2024-05-23 ENCOUNTER — Ambulatory Visit (HOSPITAL_COMMUNITY)
Admission: RE | Admit: 2024-05-23 | Discharge: 2024-05-23 | Disposition: A | Source: Ambulatory Visit | Attending: Internal Medicine

## 2024-05-23 DIAGNOSIS — F17200 Nicotine dependence, unspecified, uncomplicated: Secondary | ICD-10-CM | POA: Diagnosis present

## 2024-07-03 ENCOUNTER — Encounter: Payer: Self-pay | Admitting: Physician Assistant

## 2024-07-27 ENCOUNTER — Ambulatory Visit: Admitting: Physician Assistant

## 2024-07-27 NOTE — Progress Notes (Unsigned)
 "    07/27/2024 Jaime Adams [id number] 1960-08-19  Referring provider: Lovie Clarity, MD Primary GI doctor: {[id number]}  ASSESSMENT AND PLAN:  Early satiety, nausea, vomiting, diarrhea with weight loss 05/2024 CT chest lung cancer screening unremarkable, upper abdomen normal imaged portions of liver spleen stomach pancreas gallbladder adrenal glands and kidney 04/02/2024 lipase 42, thyroid 0.571   Elevated LFTs in setting of chronic alcohol use 04/02/2024 AST 56, ALT 19, alk phos 130, total bilirubin 0.3, albumin low at 3.4, platelets 244  History of chronic alcohol use with neuropathy 04/02/2024 B12 782, folate 3.3  Macrocytic anemia  COPD emphysema Seen on CT lung cancer screening 2025  Atrial fibrillation 02/08/2023 echocardiogram EF 6065% grade 1 diastolic dysfunction mild MR no aortic stenosis  CCS 09/10/2022 Cologuard negative   Patient Care Team: Renne Homans, MD as PCP - General  HISTORY OF PRESENT ILLNESS: 64 y.o. female with a past medical history listed below presents for evaluation of ***.   *** Discussed the use of AI scribe software for clinical note transcription with the patient, who gave verbal consent to proceed.  History of Present Illness            She  reports that she has been smoking cigarettes. She has a 20 pack-year smoking history. She has never used smokeless tobacco. She reports that she does not currently use alcohol. She reports that she does not use drugs.  RELEVANT GI HISTORY, IMAGING AND LABS: Results          CBC    Component Value Date/Time   WBC 4.4 04/02/2024 1543   WBC 5.1 03/04/2023 1030   RBC 2.84 (L) 04/02/2024 1543   RBC 2.96 (L) 03/04/2023 1030   HGB 9.0 (L) 04/02/2024 1543   HCT 27.7 (L) 04/02/2024 1543   PLT 244 04/02/2024 1543   MCV 98 (H) 04/02/2024 1543   MCH 31.7 04/02/2024 1543   MCH 31.8 03/04/2023 1030   MCHC 32.5 04/02/2024 1543   MCHC 33.6 03/04/2023 1030   RDW 16.4 (H) 04/02/2024 1543    LYMPHSABS 2.0 01/06/2023 1348   LYMPHSABS CANCELED 06/16/2022 1149   MONOABS 0.4 01/06/2023 1348   EOSABS 0.0 01/06/2023 1348   EOSABS CANCELED 06/16/2022 1149   BASOSABS 0.0 01/06/2023 1348   BASOSABS CANCELED 06/16/2022 1149   Recent Labs    04/02/24 1543  HGB 9.0*    CMP     Component Value Date/Time   NA 144 04/02/2024 1543   K 3.4 (L) 04/02/2024 1543   CL 106 04/02/2024 1543   CO2 18 (L) 04/02/2024 1543   GLUCOSE 72 04/02/2024 1543   GLUCOSE 94 01/06/2023 1348   BUN 7 (L) 04/02/2024 1543   CREATININE 0.79 04/02/2024 1543   CALCIUM  8.5 (L) 04/02/2024 1543   PROT 6.6 04/02/2024 1543   ALBUMIN 3.4 (L) 04/02/2024 1543   AST 56 (H) 04/02/2024 1543   ALT 19 04/02/2024 1543   ALKPHOS 130 04/02/2024 1543   BILITOT 0.3 04/02/2024 1543   GFRNONAA >60 01/06/2023 1348   GFRAA 97 07/24/2020 1607      Latest Ref Rng & Units 04/02/2024    3:43 PM 01/06/2023    1:48 PM 07/07/2022    4:34 PM  Hepatic Function  Total Protein 6.0 - 8.5 g/dL 6.6  8.8  8.8   Albumin 3.9 - 4.9 g/dL 3.4  4.4  4.6   AST 0 - 40 IU/L 56  26  36   ALT 0 - 32 IU/L  19  14  13    Alk Phosphatase 49 - 135 IU/L 130  99  92   Total Bilirubin 0.0 - 1.2 mg/dL 0.3  1.3  1.2       Current Medications:   Current Outpatient Medications (Cardiovascular):    rosuvastatin  (CRESTOR ) 20 MG tablet, Take 1 tablet (20 mg total) by mouth daily.  Current Outpatient Medications (Analgesics):    ibuprofen  (ADVIL ) 800 MG tablet, Take 800 mg by mouth as needed.  Current Outpatient Medications (Other):    chlorhexidine (PERIDEX) 0.12 % solution, FILL CAP TO FILL LINE ( ) SWISH IN MOUTH FOR 30 SECONDS THEN SPIT OUT. USE AFTER BREAKFAST AND BEFORE BEDTIME   gabapentin  (NEURONTIN ) 100 MG capsule, Take 1 capsule (100 mg total) by mouth 3 (three) times daily.  Medical History:  Past Medical History:  Diagnosis Date   AKI (acute kidney injury) 07/08/2022   Cellulitis 03/18/2016   Dehydration 07/08/2022   Facial  cellulitis 03/18/2016   High cholesterol    Hypertension    Hyponatremia 07/08/2022   Tobacco use disorder 08/10/2022   Vitamin D  deficiency 07/09/2022   Allergies: Allergies[1]   Surgical History:  She  has no past surgical history on file. Family History:  Her family history includes Alzheimer's disease in her mother; Aneurysm in her brother; Cancer in her maternal aunt; Cirrhosis in her father; Diabetes in her brother; Heart attack in her brother; Hypertension in her mother; Stroke in her brother.  REVIEW OF SYSTEMS  : All other systems reviewed and negative except where noted in the History of Present Illness.  PHYSICAL EXAM: There were no vitals taken for this visit. Physical Exam          Alan JONELLE Coombs, PA-C 8:02 AM      [1] No Known Allergies  "

## 2024-08-22 ENCOUNTER — Ambulatory Visit: Admitting: Physician Assistant
